# Patient Record
Sex: Male | Born: 1940 | Race: White | Hispanic: No | Marital: Married | Smoking: Former smoker
Health system: Southern US, Community
[De-identification: ages and names within clinical notes are randomized; demographics above are authoritative.]

## PROBLEM LIST (undated history)

## (undated) DIAGNOSIS — C801 Malignant (primary) neoplasm, unspecified: Secondary | ICD-10-CM

## (undated) DIAGNOSIS — I219 Acute myocardial infarction, unspecified: Secondary | ICD-10-CM

## (undated) DIAGNOSIS — N200 Calculus of kidney: Secondary | ICD-10-CM

## (undated) DIAGNOSIS — Z5189 Encounter for other specified aftercare: Secondary | ICD-10-CM

## (undated) DIAGNOSIS — I509 Heart failure, unspecified: Secondary | ICD-10-CM

## (undated) DIAGNOSIS — E119 Type 2 diabetes mellitus without complications: Secondary | ICD-10-CM

## (undated) DIAGNOSIS — I2510 Atherosclerotic heart disease of native coronary artery without angina pectoris: Secondary | ICD-10-CM

## (undated) DIAGNOSIS — I10 Essential (primary) hypertension: Secondary | ICD-10-CM

## (undated) HISTORY — PX: BACK SURGERY: SHX140

## (undated) HISTORY — PX: CORONARY ANGIOPLASTY WITH STENT PLACEMENT: SHX49

## (undated) HISTORY — PX: BLADDER REMOVAL: SHX567

## (undated) HISTORY — PX: REVISION UROSTOMY CUTANEOUS: SUR1282

---

## 2018-05-16 ENCOUNTER — Inpatient Hospital Stay (HOSPITAL_BASED_OUTPATIENT_CLINIC_OR_DEPARTMENT_OTHER)
Admission: EM | Admit: 2018-05-16 | Discharge: 2018-05-19 | DRG: 698 | Disposition: A | Discharge status: Home / Self Care | Payer: Medicare Other | Attending: Internal Medicine | Admitting: Internal Medicine

## 2018-05-16 ENCOUNTER — Other Ambulatory Visit: Payer: Self-pay

## 2018-05-16 ENCOUNTER — Encounter (HOSPITAL_BASED_OUTPATIENT_CLINIC_OR_DEPARTMENT_OTHER): Payer: Self-pay | Admitting: *Deleted

## 2018-05-16 ENCOUNTER — Emergency Department (HOSPITAL_BASED_OUTPATIENT_CLINIC_OR_DEPARTMENT_OTHER): Payer: Medicare Other

## 2018-05-16 DIAGNOSIS — Z7902 Long term (current) use of antithrombotics/antiplatelets: Secondary | ICD-10-CM

## 2018-05-16 DIAGNOSIS — M109 Gout, unspecified: Secondary | ICD-10-CM | POA: Diagnosis present

## 2018-05-16 DIAGNOSIS — Z87442 Personal history of urinary calculi: Secondary | ICD-10-CM

## 2018-05-16 DIAGNOSIS — T83518A Infection and inflammatory reaction due to other urinary catheter, initial encounter: Principal | ICD-10-CM | POA: Diagnosis present

## 2018-05-16 DIAGNOSIS — Z955 Presence of coronary angioplasty implant and graft: Secondary | ICD-10-CM

## 2018-05-16 DIAGNOSIS — I252 Old myocardial infarction: Secondary | ICD-10-CM | POA: Diagnosis not present

## 2018-05-16 DIAGNOSIS — Z7984 Long term (current) use of oral hypoglycemic drugs: Secondary | ICD-10-CM

## 2018-05-16 DIAGNOSIS — N390 Urinary tract infection, site not specified: Secondary | ICD-10-CM | POA: Diagnosis present

## 2018-05-16 DIAGNOSIS — I110 Hypertensive heart disease with heart failure: Secondary | ICD-10-CM | POA: Diagnosis present

## 2018-05-16 DIAGNOSIS — Z8551 Personal history of malignant neoplasm of bladder: Secondary | ICD-10-CM | POA: Diagnosis not present

## 2018-05-16 DIAGNOSIS — E1140 Type 2 diabetes mellitus with diabetic neuropathy, unspecified: Secondary | ICD-10-CM | POA: Diagnosis present

## 2018-05-16 DIAGNOSIS — R509 Fever, unspecified: Secondary | ICD-10-CM

## 2018-05-16 DIAGNOSIS — N3000 Acute cystitis without hematuria: Secondary | ICD-10-CM | POA: Diagnosis present

## 2018-05-16 DIAGNOSIS — Z7982 Long term (current) use of aspirin: Secondary | ICD-10-CM | POA: Diagnosis not present

## 2018-05-16 DIAGNOSIS — N179 Acute kidney failure, unspecified: Secondary | ICD-10-CM | POA: Diagnosis present

## 2018-05-16 DIAGNOSIS — Z885 Allergy status to narcotic agent status: Secondary | ICD-10-CM | POA: Diagnosis not present

## 2018-05-16 DIAGNOSIS — I4510 Unspecified right bundle-branch block: Secondary | ICD-10-CM | POA: Diagnosis present

## 2018-05-16 DIAGNOSIS — A4159 Other Gram-negative sepsis: Secondary | ICD-10-CM | POA: Diagnosis present

## 2018-05-16 DIAGNOSIS — I2510 Atherosclerotic heart disease of native coronary artery without angina pectoris: Secondary | ICD-10-CM | POA: Diagnosis present

## 2018-05-16 DIAGNOSIS — Z888 Allergy status to other drugs, medicaments and biological substances status: Secondary | ICD-10-CM

## 2018-05-16 DIAGNOSIS — K219 Gastro-esophageal reflux disease without esophagitis: Secondary | ICD-10-CM | POA: Diagnosis present

## 2018-05-16 DIAGNOSIS — I509 Heart failure, unspecified: Secondary | ICD-10-CM | POA: Diagnosis present

## 2018-05-16 DIAGNOSIS — Z87891 Personal history of nicotine dependence: Secondary | ICD-10-CM

## 2018-05-16 DIAGNOSIS — R7881 Bacteremia: Secondary | ICD-10-CM | POA: Diagnosis not present

## 2018-05-16 DIAGNOSIS — Z936 Other artificial openings of urinary tract status: Secondary | ICD-10-CM | POA: Diagnosis not present

## 2018-05-16 HISTORY — DX: Atherosclerotic heart disease of native coronary artery without angina pectoris: I25.10

## 2018-05-16 HISTORY — DX: Acute myocardial infarction, unspecified: I21.9

## 2018-05-16 HISTORY — DX: Type 2 diabetes mellitus without complications: E11.9

## 2018-05-16 HISTORY — DX: Heart failure, unspecified: I50.9

## 2018-05-16 HISTORY — DX: Essential (primary) hypertension: I10

## 2018-05-16 HISTORY — DX: Malignant (primary) neoplasm, unspecified: C80.1

## 2018-05-16 HISTORY — DX: Calculus of kidney: N20.0

## 2018-05-16 HISTORY — DX: Encounter for other specified aftercare: Z51.89

## 2018-05-16 LAB — URINALYSIS, MICROSCOPIC (REFLEX)

## 2018-05-16 LAB — COMPREHENSIVE METABOLIC PANEL
ALT: 14 U/L (ref 0–44)
AST: 12 U/L — AB (ref 15–41)
Albumin: 3.3 g/dL — ABNORMAL LOW (ref 3.5–5.0)
Alkaline Phosphatase: 70 U/L (ref 38–126)
Anion gap: 9 (ref 5–15)
BUN: 27 mg/dL — ABNORMAL HIGH (ref 8–23)
CO2: 21 mmol/L — ABNORMAL LOW (ref 22–32)
Calcium: 8.9 mg/dL (ref 8.9–10.3)
Chloride: 105 mmol/L (ref 98–111)
Creatinine, Ser: 1.33 mg/dL — ABNORMAL HIGH (ref 0.61–1.24)
GFR calc Af Amer: 59 mL/min — ABNORMAL LOW (ref >60)
GFR calc non Af Amer: 51 mL/min — ABNORMAL LOW (ref >60)
Glucose, Bld: 182 mg/dL — ABNORMAL HIGH (ref 70–99)
Potassium: 3.8 mmol/L (ref 3.5–5.1)
Sodium: 135 mmol/L (ref 135–145)
Total Bilirubin: 1.2 mg/dL (ref 0.3–1.2)
Total Protein: 6.7 g/dL (ref 6.5–8.1)

## 2018-05-16 LAB — CBC WITH DIFFERENTIAL/PLATELET
Abs Immature Granulocytes: 0.2 10*3/uL — ABNORMAL HIGH (ref 0.00–0.07)
BASOS ABS: 0 10*3/uL (ref 0.0–0.1)
Basophils Relative: 0 %
Eosinophils Absolute: 0 10*3/uL (ref 0.0–0.5)
Eosinophils Relative: 0 %
HCT: 40.4 % (ref 39.0–52.0)
Hemoglobin: 13.1 g/dL (ref 13.0–17.0)
Immature Granulocytes: 1 %
Lymphocytes Relative: 4 %
Lymphs Abs: 0.7 10*3/uL (ref 0.7–4.0)
MCH: 27.3 pg (ref 26.0–34.0)
MCHC: 32.4 g/dL (ref 30.0–36.0)
MCV: 84.2 fL (ref 80.0–100.0)
Monocytes Absolute: 1.4 10*3/uL — ABNORMAL HIGH (ref 0.1–1.0)
Monocytes Relative: 9 %
NRBC: 0 % (ref 0.0–0.2)
Neutro Abs: 14.4 10*3/uL — ABNORMAL HIGH (ref 1.7–7.7)
Neutrophils Relative %: 86 %
Platelets: 240 10*3/uL (ref 150–400)
RBC: 4.8 MIL/uL (ref 4.22–5.81)
RDW: 14.8 % (ref 11.5–15.5)
WBC: 16.8 10*3/uL — ABNORMAL HIGH (ref 4.0–10.5)

## 2018-05-16 LAB — URINALYSIS, ROUTINE W REFLEX MICROSCOPIC
BILIRUBIN URINE: NEGATIVE
Glucose, UA: NEGATIVE mg/dL
KETONES UR: NEGATIVE mg/dL
Nitrite: NEGATIVE
Protein, ur: NEGATIVE mg/dL
Specific Gravity, Urine: 1.01 (ref 1.005–1.030)
pH: 7 (ref 5.0–8.0)

## 2018-05-16 LAB — I-STAT CG4 LACTIC ACID, ED: LACTIC ACID, VENOUS: 1.23 mmol/L (ref 0.5–1.9)

## 2018-05-16 MED ORDER — SODIUM CHLORIDE 0.9 % IV BOLUS
500.0000 mL | Freq: Once | INTRAVENOUS | Status: AC
  Administered 2018-05-16: 500 mL via INTRAVENOUS

## 2018-05-16 MED ORDER — SODIUM CHLORIDE 0.9 % IV SOLN
1.0000 g | Freq: Two times a day (BID) | INTRAVENOUS | Status: DC
  Administered 2018-05-16 – 2018-05-17 (×2): 1 g via INTRAVENOUS
  Filled 2018-05-16 (×3): qty 1

## 2018-05-16 MED ORDER — SODIUM CHLORIDE 0.9 % IV SOLN
1.0000 g | Freq: Once | INTRAVENOUS | Status: AC
  Administered 2018-05-16: 1 g via INTRAVENOUS
  Filled 2018-05-16: qty 10

## 2018-05-16 MED ORDER — ACETAMINOPHEN 325 MG PO TABS
650.0000 mg | ORAL_TABLET | Freq: Once | ORAL | Status: AC
  Administered 2018-05-16: 650 mg via ORAL
  Filled 2018-05-16: qty 2

## 2018-05-16 MED ORDER — ONDANSETRON HCL 4 MG/2ML IJ SOLN: 4.0000 mg | Freq: Four times a day (QID) | INTRAMUSCULAR | Status: DC | PRN

## 2018-05-16 MED ORDER — SODIUM CHLORIDE 0.9 % IV SOLN
INTRAVENOUS | Status: DC | PRN
  Administered 2018-05-16: 500 mL via INTRAVENOUS

## 2018-05-16 MED ORDER — GABAPENTIN 300 MG PO CAPS
300.0000 mg | ORAL_CAPSULE | Freq: Every day | ORAL | Status: DC
  Administered 2018-05-16 – 2018-05-18 (×3): 300 mg via ORAL
  Filled 2018-05-16 (×3): qty 1

## 2018-05-16 MED ORDER — ASPIRIN EC 81 MG PO TBEC
81.0000 mg | DELAYED_RELEASE_TABLET | Freq: Every day | ORAL | Status: DC
  Administered 2018-05-17 – 2018-05-18 (×2): 81 mg via ORAL
  Filled 2018-05-16 (×4): qty 1

## 2018-05-16 MED ORDER — SODIUM CHLORIDE 0.9 % IV BOLUS
1000.0000 mL | Freq: Once | INTRAVENOUS | Status: AC
  Administered 2018-05-16: 1000 mL via INTRAVENOUS

## 2018-05-16 MED ORDER — FAMOTIDINE 20 MG PO TABS
20.0000 mg | ORAL_TABLET | Freq: Every day | ORAL | Status: DC
  Administered 2018-05-16 – 2018-05-18 (×3): 20 mg via ORAL
  Filled 2018-05-16 (×3): qty 1

## 2018-05-16 MED ORDER — CLOPIDOGREL BISULFATE 75 MG PO TABS
75.0000 mg | ORAL_TABLET | Freq: Every day | ORAL | Status: DC
  Administered 2018-05-17 – 2018-05-19 (×3): 75 mg via ORAL
  Filled 2018-05-16 (×3): qty 1

## 2018-05-16 MED ORDER — ATORVASTATIN CALCIUM 10 MG PO TABS
20.0000 mg | ORAL_TABLET | Freq: Every day | ORAL | Status: DC
  Administered 2018-05-17 – 2018-05-18 (×2): 20 mg via ORAL
  Filled 2018-05-16 (×2): qty 2

## 2018-05-16 MED ORDER — ONDANSETRON HCL 4 MG PO TABS: 4.0000 mg | ORAL_TABLET | Freq: Four times a day (QID) | ORAL | Status: DC | PRN

## 2018-05-16 MED ORDER — ALLOPURINOL 300 MG PO TABS
300.0000 mg | ORAL_TABLET | Freq: Every day | ORAL | Status: DC
  Administered 2018-05-17 – 2018-05-19 (×3): 300 mg via ORAL
  Filled 2018-05-16 (×3): qty 1

## 2018-05-16 MED ORDER — ENOXAPARIN SODIUM 40 MG/0.4ML ~~LOC~~ SOLN
40.0000 mg | SUBCUTANEOUS | Status: DC
  Administered 2018-05-18 – 2018-05-19 (×2): 40 mg via SUBCUTANEOUS
  Filled 2018-05-16 (×3): qty 0.4

## 2018-05-16 NOTE — Plan of Care (Signed)
[facility] PLAN OF CARE NOTE TRANSFER - ADMISSION  FROM: [facility] ED TO: [facility] (med-surg floor)   PATIENT MRN: [id number]    BRIEF SUMMARY: Jim Bowers is a 78 y.o. 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 Urine reportedly appeared "dark" transiently. Vitals at [facility] shows no fever, BP 108-126/77; HR 80s; normal SO2. Initial UA shows large leuk est and +bacteria, WBC and RBC. Cultures pending.    DDx includes ascending UTI with or without obstruction; other abdominal infection. Initial exam reported prior to transfer suggest patient is non-toxic and hemodynamically stable.    PLAN: - admit to [facility] medicine floor - urine and blood cultures in process - continue abx and IVF - recommend broaden to cefepime upon transfer given increased risk of pseudomonas from urostomy - hold home beta-blockers overnight - low threshold for CT abd/pelvis with contrast and urology consultation if symptoms do not improve in 24 hours     Colbert Ewing, MD Pager [phone] Go to [email or web address] - [email or web address] for contact info [facility] - Office  [phone]

## 2018-05-16 NOTE — ED Provider Notes (Signed)
[facility] EMERGENCY DEPARTMENT Provider Note   CSN: [id number] Arrival date & time: 05/16/18  1440     History   Chief Complaint Chief Complaint  Patient presents with  . Fever  . Back Pain    HPI Jim Bowers is a 78 y.o. male.  Patient with history of urostomy status post bladder and prostate cancer, coronary artery disease, history of urosepsis, diabetes --presents to the emergency department today with complaint of fatigue, fever to 100.5 F at home as well as intermittent bilateral lower back pain.  Patient reports decreased energy and appetite starting last night.  He slept and woke up with persistent symptoms today.  He has had recent cough that he relates to the fact that his home is being renovated.  Otherwise no URI symptoms.  He states that his urine has appeared dark but was clear after he drank water last night.  He denies any nausea, vomiting, or diarrhea.  No skin rashes.  No weakness, numbness, or tingling in the legs. He is worried about recurrent sepsis.      Past Medical History:  Diagnosis Date  . Blood transfusion without reported diagnosis   . Cancer [facility])    bladder and prostate  . CHF (congestive heart failure) ([place])   . Coronary artery disease   . Diabetes mellitus without complication ([place])   . Heart attack ([place])   . Hypertension   . Kidney stone     There are no active problems to display for this patient.   Past Surgical History:  Procedure Laterality Date  . BACK SURGERY    . BLADDER REMOVAL    . CORONARY ANGIOPLASTY WITH STENT PLACEMENT    . REVISION UROSTOMY CUTANEOUS          Home Medications    Prior to Admission medications   Medication Sig Start Date End Date Taking? Authorizing Provider  allopurinol (ZYLOPRIM) 300 MG tablet TAKE 1 TABLET(300 MG) BY MOUTH DAILY 04/19/18  Yes [provider]  atorvastatin (LIPITOR) 20 MG tablet TAKE 1 TABLET(20 MG) BY MOUTH DAILY 03/17/18  Yes [provider]   carvedilol (COREG) 25 MG tablet Take by mouth. 11/18/17  Yes [provider]  clopidogrel (PLAVIX) 75 MG tablet TAKE 1 TABLET(75 MG) BY MOUTH DAILY 02/25/18  Yes [provider]  gabapentin (NEURONTIN) 300 MG capsule Take by mouth. 02/25/18  Yes [provider]  loperamide (IMODIUM) 2 MG capsule Take by mouth. 10/16/16  Yes [provider]  losartan (COZAAR) 50 MG tablet TAKE 1 TABLET(50 MG) BY MOUTH DAILY 03/17/18  Yes [provider]  metFORMIN (GLUCOPHAGE-XR) 500 MG 24 hr tablet Take by mouth. 01/11/18  Yes [provider]  nitroGLYCERIN (NITROSTAT) 0.4 MG SL tablet ONE TABLET UNDER TONGUE AS NEEDED FOR CHEST PAIN EVERY 5 MINUTES FOR UP TO 3 DOSES IN A ROW 08/19/17  Yes [provider]  sitaGLIPtin (JANUVIA) 100 MG tablet Take by mouth. 04/14/18  Yes [provider]  traMADol (ULTRAM) 50 MG tablet Take by mouth. 07/02/16  Yes [provider]  aspirin EC 81 MG tablet Take by mouth.    [provider]  famotidine (PEPCID) 20 MG tablet Take by mouth.    [provider]  nystatin (MYCOSTATIN/NYSTOP) powder Apply topically.    [provider]  vitamin B-12 (CYANOCOBALAMIN) 1000 MCG tablet Take by mouth.    [provider]    Family History No family history on file.  Social History Social History  Tobacco Use  . Smoking status: Former Research scientist (life sciences)  . Smokeless tobacco: Former Network engineer Use Topics  . Alcohol use: Never    Frequency: Never  . Drug use: Never     Allergies   Codeine; Hyoscyamine; Glimepiride; Pioglitazone; and Colchicine   Review of Systems Review of Systems  Constitutional: Positive for appetite change and fatigue. Negative for fever.  HENT: Negative for rhinorrhea and sore throat.   Eyes: Negative for redness.  Respiratory: Positive for cough.   Cardiovascular: Negative for chest pain.  Gastrointestinal: Negative for abdominal pain, diarrhea, nausea  and vomiting.  Genitourinary: Negative for hematuria.  Musculoskeletal: Positive for back pain. Negative for myalgias.  Skin: Negative for rash.  Neurological: Negative for headaches.     Physical Exam Updated Vital Signs BP 108/62 (BP Location: Right Arm)   Pulse 88   Temp 98.2 F (36.8 C) (Oral)   Resp 18   Ht 5\' 7"  (1.702 m)   Wt 81.6 kg   SpO2 100%   BMI 28.19 kg/m   Physical Exam Vitals signs and nursing note reviewed.  Constitutional:      Appearance: He is well-developed.  HENT:     Head: Normocephalic and atraumatic.     Mouth/Throat:     Mouth: Mucous membranes are moist.  Eyes:     General:        Right eye: No discharge.        Left eye: No discharge.     Conjunctiva/sclera: Conjunctivae normal.  Neck:     Musculoskeletal: Normal range of motion and neck supple.  Cardiovascular:     Rate and Rhythm: Normal rate and regular rhythm.     Heart sounds: Normal heart sounds.  Pulmonary:     Effort: Pulmonary effort is normal.     Breath sounds: Normal breath sounds.  Abdominal:     Palpations: Abdomen is soft.     Tenderness: There is no abdominal tenderness. There is no right CVA tenderness, left CVA tenderness, guarding or rebound.     Comments: Urostomy in place lower abdomen  Skin:    General: Skin is warm and dry.  Neurological:     General: No focal deficit present.     Mental Status: He is alert and oriented to person, place, and time.     Comments: Normal strength lower extremities      ED Treatments / Results  Labs (all labs ordered are listed, but only abnormal results are displayed) Labs Reviewed  COMPREHENSIVE METABOLIC PANEL - Abnormal; Notable for the following components:      Result Value   CO2 21 (*)    Glucose, Bld 182 (*)    BUN 27 (*)    Creatinine, Ser 1.33 (*)    Albumin 3.3 (*)    AST 12 (*)    GFR calc non Af Amer 51 (*)    GFR calc Af Amer 59 (*)    All other components within normal limits  CBC WITH  DIFFERENTIAL/PLATELET - Abnormal; Notable for the following components:   WBC 16.8 (*)    Neutro Abs 14.4 (*)    Monocytes Absolute 1.4 (*)    Abs Immature Granulocytes 0.20 (*)    All other components within normal limits  URINALYSIS, ROUTINE W REFLEX MICROSCOPIC - Abnormal; Notable for the following components:   APPearance HAZY (*)    Hgb urine dipstick SMALL (*)    Leukocytes, UA LARGE (*)    All other components within  normal limits  URINALYSIS, MICROSCOPIC (REFLEX) - Abnormal; Notable for the following components:   Bacteria, UA MANY (*)    All other components within normal limits  CULTURE, BLOOD (ROUTINE X 2)  CULTURE, BLOOD (ROUTINE X 2)  URINE CULTURE  I-STAT CG4 LACTIC ACID, ED  I-STAT CG4 LACTIC ACID, ED    EKG EKG Interpretation  Date/Time:  Sunday May 16 2018 16:32:00 EST Ventricular Rate:  82 PR Interval:    QRS Duration: 130 QT Interval:  394 QTC Calculation: 461 R Axis:   -73 Text Interpretation:  Sinus rhythm Right bundle branch block Anterior infarct, old No old tracing to compare Confirmed by Malvin Johns [phone]) on 05/16/2018 5:02:13 PM   Radiology Dg Chest 2 View  Result Date: 05/16/2018 CLINICAL DATA:  Fever and back pain EXAM: CHEST - 2 VIEW COMPARISON:  None. FINDINGS: Cardiac shadows within normal limits. Diffuse aortic calcifications are noted. The lungs are clear. Mild degenerative change of the thoracic spine is noted. IMPRESSION: No acute abnormality noted. Electronically Signed   By: Inez Catalina M.D.   On: 05/16/2018 16:19    Procedures Procedures (including critical care time)  Medications Ordered in ED Medications  0.9 %  sodium chloride infusion ( Intravenous Stopped 05/16/18 1837)  cefTRIAXone (ROCEPHIN) 1 g in sodium chloride 0.9 % 100 mL IVPB ( Intravenous Stopped 05/16/18 1803)  sodium chloride 0.9 % bolus 500 mL ( Intravenous Stopped 05/16/18 1755)     Initial Impression / Assessment and Plan / ED Course  I have reviewed  the triage vital signs and the nursing notes.  Pertinent labs & imaging results that were available during my care of the patient were reviewed by me and considered in my medical decision making (see chart for details).     Patient seen and examined. Work-up initiated. Medications ordered.   Vital signs reviewed and are as follows: BP 108/62 (BP Location: Right Arm)   Pulse 88   Temp 98.2 F (36.8 C) (Oral)   Resp 18   Ht 5\' 7"  (1.702 m)   Wt 81.6 kg   SpO2 100%   BMI 28.19 kg/m   5:17 PM discussed patient with Dr. Tamera Punt who was seen earlier.  Patient in family updated on results.  We discussed likely diagnosis is urinary tract infection at this time.  We discussed normal lactic acid, elevated white blood cell count, pending blood and urine cultures.  Will give fluids, antibiotics and oral fluid challenge.  We discussed the pros and cons of admission versus discharge to home with close PCP follow-up.  Awaiting patient input to see what he is most comfortable with.  6:27 PM Pt requests admission to hospital. Will see if [facility] at [facility].    7:33 PM Spoke with Dr. Hampton Abbot who accepts patient for admission.   Final Clinical Impressions(s) / ED Diagnoses   Final diagnoses:  Acute cystitis without hematuria  Fever, unspecified fever cause   Admit febrile UTI.    ED Discharge Orders    None       Carlisle Cater, Hershal Coria 05/16/18 1934    Malvin Johns, MD 05/16/18 2158

## 2018-05-16 NOTE — ED Notes (Signed)
Pt noted to be tacypneic with increased work of breathing. Respiratory and provider notified.

## 2018-05-16 NOTE — H&P (Signed)
History and Physical  Jim Bowers TMH:[id number] DOB: 06-Apr-1941 DOA: 05/16/2018 1504  Referring physician: [facility]) PCP: Thomes Dinning, MD  Outpatient Specialists: oncology at [facility]; cardiology at [facility])  HISTORY   Chief Complaint: suspected UTI (transfer from [facility])  HPI: [name] is a 78 y.o. 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 Temp measured at home was 100.7F.  Patient reported seeing dark urine output in his urostomy bag yesterday evening that lightened in color with increased fluid intake. Otherwise patient has not seen any sediment or blood in urine output. Patient reports changing urostomy bag every 3-4 days as directed.   Last year, he had similar symptoms (bilateral flank pain, malaise) but did not seek medical care until he became altered and much sicker -- was ultimately admitted to ICU at OSH in [facility] for several days for urosepsis.     Review of Systems:  +low grade fever; bilateral flank pain - no cough - no chest pain, dyspnea on exertion - no edema, PND, orthopnea - no nausea/vomiting; no tarry, melanotic or bloody stools - no weight changes Rest of systems reviewed are negative, except as per above history.   ED course:  Vitals Blood pressure 122/68, pulse 92, temperature 99.3 F (37.4 C), temperature source Oral, resp. rate 20, height 5\' 7"  (1.702 m), weight 81.6 kg, SpO2 98 %. At [facility] ED: received IV ceftriaxone 1gm x 1; NS bolus 500cc x 1; tylenol 650mg  x 1 (prior to transfer to [facility])  Past Medical History:  Diagnosis Date  . Blood transfusion without reported diagnosis   . Cancer [facility])    bladder and prostate  . CHF (congestive heart failure) ([place])   . Coronary artery disease   . Diabetes mellitus without complication ([place])   . Heart attack ([place])   . Hypertension    . Kidney stone    Past Surgical History:  Procedure Laterality Date  . BACK SURGERY    . BLADDER REMOVAL    . CORONARY ANGIOPLASTY WITH STENT PLACEMENT    . REVISION UROSTOMY CUTANEOUS      Social History:  reports that he has quit smoking. He has quit using smokeless tobacco. He reports that he does not drink alcohol or use drugs.  Allergies  Allergen Reactions  . Codeine Swelling  . Hyoscyamine Swelling    Throat swells, difficulty breathing  . Glimepiride Other (See Comments)    Low bs levels  . Pioglitazone Other (See Comments)    Pt has bladder cancer  . Colchicine Nausea Only    GI UPSET  . Iodinated Diagnostic Agents Rash    Patient states shellfish allergy; no IV contrast allergy  . Shellfish Allergy Rash    No family history on file.    Prior to Admission medications   Medication Sig Start Date End Date Taking? Authorizing Provider  allopurinol (ZYLOPRIM) 300 MG tablet Take 300 mg by mouth daily.  04/19/18  Yes [provider]  aspirin EC 81 MG tablet Take 81 mg by mouth every evening.    Yes [provider]  atorvastatin (LIPITOR) 20 MG tablet Take 20 mg by mouth every evening.  03/17/18  Yes [provider]  carvedilol (COREG) 25 MG tablet Take 12.5 mg by mouth 2 (two) times daily with a meal.  11/18/17  Yes [provider]  clopidogrel (PLAVIX) 75 MG tablet Take 75 mg by mouth daily after breakfast.  02/25/18  Yes [provider]  famotidine (PEPCID) 20 MG tablet Take 20 mg by mouth 2 (two) times daily.    Yes [provider]  gabapentin (NEURONTIN) 300 MG capsule Take 300 mg by mouth 2 (two) times daily.  02/25/18  Yes [provider]  loperamide (IMODIUM) 2 MG capsule Take 2 mg by mouth every 8 (eight) hours as needed for diarrhea or loose stools.  10/16/16  Yes [provider]  losartan (COZAAR) 50 MG tablet Take 50 mg by mouth daily after breakfast.  03/17/18  Yes [provider]    metFORMIN (GLUCOPHAGE-XR) 500 MG 24 hr tablet Take 500 mg by mouth every evening.  01/11/18  Yes [provider]  Multiple Vitamins-Minerals (ICAPS AREDS 2 PO) Take 1 tablet by mouth 2 (two) times daily.   Yes [provider]  nitroGLYCERIN (NITROSTAT) 0.4 MG SL tablet Place 0.4 mg under the tongue every 5 (five) minutes as needed for chest pain.  08/19/17  Yes [provider]  nystatin (MYCOSTATIN/NYSTOP) powder Apply 1 g topically 2 (two) times daily as needed (irritation).    Yes [provider]  sitaGLIPtin (JANUVIA) 100 MG tablet Take 100 mg by mouth daily after breakfast.  04/14/18  Yes [provider]  traMADol (ULTRAM) 50 MG tablet Take 50 mg by mouth every 6 (six) hours as needed for moderate pain or severe pain.  07/02/16  Yes [provider]  vitamin B-12 (CYANOCOBALAMIN) 1000 MCG tablet Take 1,000 mcg by mouth every evening.    Yes [provider]    PHYSICAL EXAM   Temp:  [98.2 F (36.8 C)-100.7 F (38.2 C)] 99.3 F (37.4 C) (01/19 2223) Pulse Rate:  [81-101] 92 (01/19 2223) Resp:  [14-30] 20 (01/19 2223) BP: (108-126)/(60-77) 122/68 (01/19 2223) SpO2:  [95 %-100 %] 98 % (01/19 2223) Weight:  [81.6 kg] 81.6 kg (01/19 1450)  BP 122/68 (BP Location: Left Arm)   Pulse 92   Temp 99.3 F (37.4 C) (Oral)   Resp 20   Ht 5\' 7"  (1.702 m)   Wt 81.6 kg   SpO2 98%   BMI 28.19 kg/m    GEN well-nourished elderly caucasian male; resting comfortably in bed  HEENT NCAT EOM intact PERRL; clear oropharynx, no cervical LAD; dry mucus membranes  JVP estimated 5 cm H2O above RA; no HJR ; no carotid bruits b/l ;  CV regular normal rate; normal S1 and S2; no m/r/g or S3/S4; PMI non displaced;   RESP CTA b/l; breathing unlabored and symmetric  ABD soft NT ND +normoactive BS; +mild bilateral CVA tenderness  EXT warm throughout b/l; no peripheral edema b/l  PULSES  DP and radials 2+ intact b/l  SKIN/MSK no rashes or lesions   NEURO/PSYCH AAOx4; no focal deficits   DATA   LABS ON ADMISSION:  Basic Metabolic Panel: Recent Labs  Lab 05/16/18 1558  NA 135  K 3.8  CL 105  CO2 21*  GLUCOSE 182*  BUN 27*  CREATININE 1.33*  CALCIUM 8.9   CBC: Recent Labs  Lab 05/16/18 1558  WBC 16.8*  NEUTROABS 14.4*  HGB 13.1  HCT 40.4  MCV 84.2  PLT 240   Liver Function Tests: Recent Labs  Lab 05/16/18 1558  AST 12*  ALT 14  ALKPHOS 70  BILITOT 1.2  PROT 6.7  ALBUMIN 3.3*   No results for input(s):  LIPASE, AMYLASE in the last 168 hours. No results for input(s): AMMONIA in the last 168 hours. Coagulation:  No results found for: INR, PROTIME No results found for: PTT Lactic Acid, Venous:     Component Value Date/Time   LATICACIDVEN 1.23 05/16/2018 1606   Cardiac Enzymes: No results for input(s): CKTOTAL, CKMB, CKMBINDEX, TROPONINI in the last 168 hours. Urinalysis:    Component Value Date/Time   COLORURINE YELLOW 05/16/2018 1614   APPEARANCEUR HAZY (A) 05/16/2018 1614   LABSPEC 1.010 05/16/2018 1614   PHURINE 7.0 05/16/2018 1614   GLUCOSEU NEGATIVE 05/16/2018 1614   HGBUR SMALL (A) 05/16/2018 1614   BILIRUBINUR NEGATIVE 05/16/2018 1614   KETONESUR NEGATIVE 05/16/2018 1614   PROTEINUR NEGATIVE 05/16/2018 1614   NITRITE NEGATIVE 05/16/2018 1614   LEUKOCYTESUR LARGE (A) 05/16/2018 1614    BNP (last 3 results) No results for input(s): PROBNP in the last 8760 hours. CBG: No results for input(s): GLUCAP in the last 168 hours.  Radiological Exams on Admission: Dg Chest 2 View  Result Date: 05/16/2018 CLINICAL DATA:  Fever and back pain EXAM: CHEST - 2 VIEW COMPARISON:  None. FINDINGS: Cardiac shadows within normal limits. Diffuse aortic calcifications are noted. The lungs are clear. Mild degenerative change of the thoracic spine is noted. IMPRESSION: No acute abnormality noted. Electronically Signed   By: Inez Catalina M.D.   On: 05/16/2018 16:19    EKG: Independently reviewed. NSR, RBBB,  old anterior infarct  I have reviewed the patient's previous electronic chart records, labs, and other data.   ASSESSMENT AND PLAN   Assessment: Jim Bowers is a 78 y.o. 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 Patient reports sx were similar to previous episode of urosepsis, although not as severe, since he sought medical care earlier on this admission. Good urine output in urostomy; thus obstruction unlikely at this time. Will broaden abx coverage to include pseudomonas overnight. Non-toxic appearing.    Active Problems:   UTI (urinary tract infection)   Plan:   # UTI, concern for ascending infection in setting of urostomy/ileal conduit. Prior hx of urosepsis - urine and blood cultures in process - abx: s/p ceftriaxone D1: 05/16/18 at [facility] ED - switch to cefepime 1gm q12h (05/17/18) - narrow abx as per cultures - urostomy nursing care - urine and blood cultures in process - NS bolus overnight - tylenol prn   # AKI with Cr baseline 1.1-1.2, likely pre-renal vs intrinsic in setting of infection  - s/p IVF as above  - hold home losartan and metformin  - avoid NSAIDs and other nephrotoxic agents  - monitor with daily BMP  # Hx of CAD s/p remote MI and PCI - resume home aspirin and atorvastatin tomorrow  # Hx of HTN  - hold home coreg and losartan overnight   # Hx of gout - no flare at this time - resume allopurinol 300mg  daily  # Hx of reflux - resume pepcid  # Hx of neuropathic pain - resume gabapentin 300mg  nightly   # DMT2 - not on insulin - hold metformin and sitagliptin while inpatient - fingersticks ACHS and low dose SSI with meals     DVT Prophylaxis: lovenox  Code Status:  Full Code Family Communication: discussed plan with patient at bedside Disposition Plan: admit to floor; dispo  pending urine  culture results and sx   Patient contact: Extended Emergency Contact Information Primary Emergency Contact: Niles, Sebastian Phone: [phone] Relation: Spouse  Time spent: > 35 mins  Colbert Ewing, MD [facility] Pager [phone]  If 7PM-7AM, please contact night-coverage [email or web address] Password [facility] 05/16/2018, 11:57 PM

## 2018-05-16 NOTE — ED Notes (Signed)
Report given to [facility]. 

## 2018-05-16 NOTE — ED Triage Notes (Signed)
Pt reports fever today and back pain x 2 days. Concerned for kidney infection. Pt reports increased fatigue and feeling unwell. Hx of sepsis last year. Pt has hx of bladder ca and has a urostomy. He took 2 tylenol pta for temp 100.5 at home. Alert and oriented

## 2018-05-16 NOTE — ED Notes (Signed)
AMBULATING:  Pt able to ambulate unassisted with slow and steady gait around half of department. Pt denies any dizziness or light headed feeling. Pt c/o feeling tired all over and weak.

## 2018-05-16 NOTE — ED Notes (Signed)
Pt remains on cardiac monitor and auto VS 

## 2018-05-17 DIAGNOSIS — R7881 Bacteremia: Secondary | ICD-10-CM

## 2018-05-17 LAB — GLUCOSE, CAPILLARY
Glucose-Capillary: 157 mg/dL — ABNORMAL HIGH (ref 70–99)
Glucose-Capillary: 163 mg/dL — ABNORMAL HIGH (ref 70–99)
Glucose-Capillary: 164 mg/dL — ABNORMAL HIGH (ref 70–99)
Glucose-Capillary: 203 mg/dL — ABNORMAL HIGH (ref 70–99)
Glucose-Capillary: 225 mg/dL — ABNORMAL HIGH (ref 70–99)

## 2018-05-17 LAB — CBC WITH DIFFERENTIAL/PLATELET
Abs Immature Granulocytes: 0.14 10*3/uL — ABNORMAL HIGH (ref 0.00–0.07)
BASOS ABS: 0 10*3/uL (ref 0.0–0.1)
BASOS PCT: 0 %
Eosinophils Absolute: 0 10*3/uL (ref 0.0–0.5)
Eosinophils Relative: 0 %
HCT: 37 % — ABNORMAL LOW (ref 39.0–52.0)
Hemoglobin: 11.7 g/dL — ABNORMAL LOW (ref 13.0–17.0)
Immature Granulocytes: 1 %
Lymphocytes Relative: 4 %
Lymphs Abs: 0.5 10*3/uL — ABNORMAL LOW (ref 0.7–4.0)
MCH: 27.4 pg (ref 26.0–34.0)
MCHC: 31.6 g/dL (ref 30.0–36.0)
MCV: 86.7 fL (ref 80.0–100.0)
Monocytes Absolute: 1 10*3/uL (ref 0.1–1.0)
Monocytes Relative: 7 %
NRBC: 0 % (ref 0.0–0.2)
Neutro Abs: 13.4 10*3/uL — ABNORMAL HIGH (ref 1.7–7.7)
Neutrophils Relative %: 88 %
Platelets: 192 10*3/uL (ref 150–400)
RBC: 4.27 MIL/uL (ref 4.22–5.81)
RDW: 14.9 % (ref 11.5–15.5)
WBC: 15.1 10*3/uL — AB (ref 4.0–10.5)

## 2018-05-17 LAB — BLOOD CULTURE ID PANEL (REFLEXED)
Acinetobacter baumannii: NOT DETECTED
Candida albicans: NOT DETECTED
Candida glabrata: NOT DETECTED
Candida krusei: NOT DETECTED
Candida parapsilosis: NOT DETECTED
Candida tropicalis: NOT DETECTED
Carbapenem resistance: NOT DETECTED
ENTEROBACTER CLOACAE COMPLEX: NOT DETECTED
Enterobacteriaceae species: DETECTED — AB
Enterococcus species: NOT DETECTED
Escherichia coli: NOT DETECTED
Haemophilus influenzae: NOT DETECTED
Klebsiella oxytoca: DETECTED — AB
Klebsiella pneumoniae: NOT DETECTED
Listeria monocytogenes: NOT DETECTED
Neisseria meningitidis: NOT DETECTED
PSEUDOMONAS AERUGINOSA: NOT DETECTED
Proteus species: NOT DETECTED
Serratia marcescens: NOT DETECTED
Staphylococcus aureus (BCID): NOT DETECTED
Staphylococcus species: NOT DETECTED
Streptococcus agalactiae: NOT DETECTED
Streptococcus pneumoniae: NOT DETECTED
Streptococcus pyogenes: NOT DETECTED
Streptococcus species: NOT DETECTED

## 2018-05-17 LAB — BASIC METABOLIC PANEL
Anion gap: 9 (ref 5–15)
BUN: 27 mg/dL — ABNORMAL HIGH (ref 8–23)
CO2: 18 mmol/L — ABNORMAL LOW (ref 22–32)
Calcium: 8.6 mg/dL — ABNORMAL LOW (ref 8.9–10.3)
Chloride: 112 mmol/L — ABNORMAL HIGH (ref 98–111)
Creatinine, Ser: 1.3 mg/dL — ABNORMAL HIGH (ref 0.61–1.24)
GFR calc Af Amer: >60 mL/min (ref >60)
GFR calc non Af Amer: 53 mL/min — ABNORMAL LOW (ref >60)
Glucose, Bld: 203 mg/dL — ABNORMAL HIGH (ref 70–99)
POTASSIUM: 3.8 mmol/L (ref 3.5–5.1)
SODIUM: 139 mmol/L (ref 135–145)

## 2018-05-17 LAB — MAGNESIUM: MAGNESIUM: 1.9 mg/dL (ref 1.7–2.4)

## 2018-05-17 MED ORDER — ACETAMINOPHEN 325 MG PO TABS
650.0000 mg | ORAL_TABLET | Freq: Four times a day (QID) | ORAL | Status: DC | PRN
  Administered 2018-05-17 – 2018-05-19 (×3): 650 mg via ORAL
  Filled 2018-05-17 (×3): qty 2

## 2018-05-17 MED ORDER — LACTATED RINGERS IV SOLN
INTRAVENOUS | Status: AC
  Administered 2018-05-17 – 2018-05-18 (×2): via INTRAVENOUS

## 2018-05-17 MED ORDER — INSULIN ASPART 100 UNIT/ML ~~LOC~~ SOLN
0.0000 [IU] | Freq: Three times a day (TID) | SUBCUTANEOUS | Status: DC
  Administered 2018-05-17 – 2018-05-18 (×3): 2 [IU] via SUBCUTANEOUS
  Administered 2018-05-18: 1 [IU] via SUBCUTANEOUS
  Administered 2018-05-18: 2 [IU] via SUBCUTANEOUS
  Administered 2018-05-19: 1 [IU] via SUBCUTANEOUS
  Administered 2018-05-19: 2 [IU] via SUBCUTANEOUS

## 2018-05-17 MED ORDER — CARVEDILOL 12.5 MG PO TABS
12.5000 mg | ORAL_TABLET | Freq: Two times a day (BID) | ORAL | Status: DC
  Administered 2018-05-18 – 2018-05-19 (×3): 12.5 mg via ORAL
  Filled 2018-05-17 (×3): qty 1

## 2018-05-17 MED ORDER — SODIUM CHLORIDE 0.9 % IV SOLN
2.0000 g | INTRAVENOUS | Status: DC
  Administered 2018-05-17: 2 g via INTRAVENOUS
  Filled 2018-05-17: qty 2

## 2018-05-17 NOTE — Progress Notes (Signed)
PHARMACY - PHYSICIAN COMMUNICATION CRITICAL VALUE ALERT - BLOOD CULTURE IDENTIFICATION (BCID)  Jim Bowers is an 78 y.o. 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  Assessment:  Klebsiella bacteremia, likely urinary source (include suspected source if known)  Name of physician (or Provider) Contacted: Dr. Florene Glen  Current antibiotics: cefepime 1g IV q12h  Changes to prescribed antibiotics recommended:  Recommendations accepted by provider - narrow to ceftriaxone 2g IV q24h  Results for orders placed or performed during the hospital encounter of 05/16/18  Blood Culture ID Panel (Reflexed) (Collected: 05/16/2018  3:52 PM)  Result Value Ref Range   Enterococcus species NOT DETECTED NOT DETECTED   Listeria monocytogenes NOT DETECTED NOT DETECTED   Staphylococcus species NOT DETECTED NOT DETECTED   Staphylococcus aureus (BCID) NOT DETECTED NOT DETECTED   Streptococcus species NOT DETECTED NOT DETECTED   Streptococcus agalactiae NOT DETECTED NOT DETECTED   Streptococcus pneumoniae NOT DETECTED NOT DETECTED   Streptococcus pyogenes NOT DETECTED NOT DETECTED   Acinetobacter baumannii NOT DETECTED NOT DETECTED   Enterobacteriaceae species DETECTED (A) NOT DETECTED   Enterobacter cloacae complex NOT DETECTED NOT DETECTED   Escherichia coli NOT DETECTED NOT DETECTED   Klebsiella oxytoca DETECTED (A) NOT DETECTED   Klebsiella pneumoniae NOT DETECTED NOT DETECTED   Proteus species NOT DETECTED NOT DETECTED   Serratia marcescens NOT DETECTED NOT DETECTED   Carbapenem resistance NOT DETECTED NOT DETECTED   Haemophilus influenzae NOT DETECTED NOT DETECTED   Neisseria meningitidis NOT DETECTED NOT DETECTED   Pseudomonas aeruginosa NOT DETECTED NOT DETECTED   Candida albicans NOT DETECTED NOT DETECTED   Candida glabrata NOT DETECTED NOT  DETECTED   Candida krusei NOT DETECTED NOT DETECTED   Candida parapsilosis NOT DETECTED NOT DETECTED   Candida tropicalis NOT DETECTED NOT DETECTED    Peggyann Juba, PharmD, BCPS Pager: [phone] 05/17/2018  5:13 PM

## 2018-05-17 NOTE — Plan of Care (Signed)
  Problem: Education: Goal: Knowledge of medication regimen will be met for pain relief regimen by discharge Outcome: Progressing   Problem: Education: Goal: Understanding of ways to prevent infection will improve by discharge Outcome: Progressing   Problem: Physical Regulation: Goal: Hemodynamic stability will return to baseline for the patient by discharge Outcome: Progressing Goal: Diagnostic test results will improve Outcome: Progressing Goal: Will remain free from infection Outcome: Progressing

## 2018-05-17 NOTE — Progress Notes (Addendum)
PROGRESS NOTE    Jim Bowers  EQA:[id number] DOB: 02-02-1941 DOA: 05/16/2018 PCP: Thomes Dinning, MD   Brief Narrative:  Jim Bowers is Jim Bowers 78 y.o. 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 Temp measured at home was 100.22F.  Patient reported seeing dark urine output in his urostomy bag yesterday evening that lightened in color with increased fluid intake. Otherwise patient has not seen any sediment or blood in urine output. Patient reports changing urostomy bag every 3-4 days as directed.   Last year, he had similar symptoms (bilateral flank pain, malaise) but did not seek medical care until he became altered and much sicker -- was ultimately admitted to ICU at OSH in [facility] for several days for urosepsis.    Assessment & Plan:   Active Problems:   UTI (urinary tract infection)   # Klebsiella Oxytoca Bacteremia 2/2 Urinary Tract Infection in setting of ileal conduit/urostomy - transition to ceftriaxone 2 grams daily, appreciate pharmacy assistance - follow final blood and urine cx - continue IVF - follow fever curve (last fever 101 1/20) - Follow leukocytosis  # AKI with Cr baseline 1.1-1.2 (I can't find this in records or care everywhere)             - continue IVF             - hold home losartan and metformin             - avoid NSAIDs and other nephrotoxic agents             - monitor with daily BMP  # Hx of CAD s/p remote MI and PCI - resume home aspirin and atorvastatin tomorrow  # Hx of HTN  - resume home coreg and hold losartan   # Hx of gout - no flare at this time - resume allopurinol 300mg  daily  # Hx of reflux - resume pepcid  # Hx of neuropathic pain - resume gabapentin 300mg  nightly  # DMT2 - not on insulin - hold metformin and sitagliptin while inpatient - fingersticks ACHS and low dose  SSI with meals  DVT prophylaxis: lovenox Code Status: full code Family Communication: none at bedside Disposition Plan: pending further improvement   Consultants:   none  Procedures:   none  Antimicrobials:  Anti-infectives (From admission, onward)   Start     Dose/Rate Route Frequency Ordered Stop   05/17/18 2200  cefTRIAXone (ROCEPHIN) 2 g in sodium chloride 0.9 % 100 mL IVPB     2 g 200 mL/hr over 30 Minutes Intravenous Every 24 hours 05/17/18 1713     05/16/18 2330  ceFEPIme (MAXIPIME) 1 g in sodium chloride 0.9 % 100 mL IVPB  Status:  Discontinued     1 g 200 mL/hr over 30 Minutes Intravenous 2 times daily 05/16/18 2319 05/17/18 1713   05/16/18 1715  cefTRIAXone (ROCEPHIN) 1 g in sodium chloride 0.9 % 100 mL IVPB     1 g 200 mL/hr over 30 Minutes Intravenous  Once 05/16/18 1706 05/16/18 1803         Subjective: Feeling better this AM than he did last night. Noted fever and back pain prior to coming in  Objective: Vitals:   05/17/18 0456 05/17/18 0517 05/17/18 0538 05/17/18 1511  BP: 125/76 (!) 125/45 129/71 118/71  Pulse: (!) 111 Marland Kitchen)  107 (!) 105 93  Resp: 20 18 18 16   Temp: (!) 102.5 F (39.2 C) (!) 101 F (38.3 C) 99 F (37.2 C) 99.3 F (37.4 C)  TempSrc: Oral Oral Oral Oral  SpO2: 97% 97% 99% 97%  Weight:      Height:        Intake/Output Summary (Last 24 hours) at 05/17/2018 1817 Last data filed at 05/17/2018 1534 Gross per 24 hour  Intake 706.23 ml  Output 3000 ml  Net -2293.77 ml   Filed Weights   05/16/18 1450  Weight: 81.6 kg    Examination:  General exam: Appears calm and comfortable  Respiratory system: Clear to auscultation. Respiratory effort normal. Cardiovascular system: S1 & S2 heard, RRR. Gastrointestinal system: Abdomen is nondistended, soft and nontender.  Urostomy present. No CVA tenderness Central nervous system: Alert and oriented. No focal neurological deficits. Extremities: no lee Skin: No rashes, lesions or  ulcers Psychiatry: Judgement and insight appear normal. Mood & affect appropriate.     Data Reviewed: I have personally reviewed following labs and imaging studies  CBC: Recent Labs  Lab 05/16/18 1558 05/17/18 0525  WBC 16.8* 15.1*  NEUTROABS 14.4* 13.4*  HGB 13.1 11.7*  HCT 40.4 37.0*  MCV 84.2 86.7  PLT 240 009   Basic Metabolic Panel: Recent Labs  Lab 05/16/18 1558 05/17/18 0525  NA 135 139  K 3.8 3.8  CL 105 112*  CO2 21* 18*  GLUCOSE 182* 203*  BUN 27* 27*  CREATININE 1.33* 1.30*  CALCIUM 8.9 8.6*  MG  --  1.9   GFR: Estimated Creatinine Clearance: 48.7 mL/min (Jim Bowers) (by C-G formula based on SCr of 1.3 mg/dL (H)). Liver Function Tests: Recent Labs  Lab 05/16/18 1558  AST 12*  ALT 14  ALKPHOS 70  BILITOT 1.2  PROT 6.7  ALBUMIN 3.3*   No results for input(s): LIPASE, AMYLASE in the last 168 hours. No results for input(s): AMMONIA in the last 168 hours. Coagulation Profile: No results for input(s): INR, PROTIME in the last 168 hours. Cardiac Enzymes: No results for input(s): CKTOTAL, CKMB, CKMBINDEX, TROPONINI in the last 168 hours. BNP (last 3 results) No results for input(s): PROBNP in the last 8760 hours. HbA1C: No results for input(s): HGBA1C in the last 72 hours. CBG: Recent Labs  Lab 05/17/18 0039 05/17/18 0753 05/17/18 1158 05/17/18 1736  GLUCAP 203* 164* 163* 157*   Lipid Profile: No results for input(s): CHOL, HDL, LDLCALC, TRIG, CHOLHDL, LDLDIRECT in the last 72 hours. Thyroid Function Tests: No results for input(s): TSH, T4TOTAL, FREET4, T3FREE, THYROIDAB in the last 72 hours. Anemia Panel: No results for input(s): VITAMINB12, FOLATE, FERRITIN, TIBC, IRON, RETICCTPCT in the last 72 hours. Sepsis Labs: Recent Labs  Lab 05/16/18 1606  LATICACIDVEN 1.23    Recent Results (from the past 240 hour(s))  Blood Culture (routine x 2)     Status: None (Preliminary result)   Collection Time: 05/16/18  3:42 PM  Result Value Ref Range  Status   Specimen Description   Final    BLOOD RIGHT ANTECUBITAL Performed at [facility], [address] [place], [place] [address]    Special Requests   Final    BOTTLES DRAWN AEROBIC AND ANAEROBIC Blood Culture adequate volume Performed at [facility], [address] [place], [place] [address]    Culture   Final    NO GROWTH < 24 HOURS Performed at [facility], [place] [address]  [place], [place] [address]    Report Status PENDING  Incomplete  Blood Culture (routine x 2)     Status: None (Preliminary result)   Collection Time: 05/16/18  3:52 PM  Result Value Ref Range Status   Specimen Description   Final    BLOOD LEFT ANTECUBITAL Performed at [facility], [address] [place], [place] [address]    Special Requests   Final    BOTTLES DRAWN AEROBIC AND ANAEROBIC Blood Culture adequate volume Performed at [facility], [address] [place], [place] [address]    Culture  Setup Time   Final    GRAM NEGATIVE RODS AEROBIC BOTTLE ONLY Organism ID to follow CRITICAL RESULT CALLED TO, READ BACK BY AND VERIFIED WITH: Seleta Rhymes PharmD 17:10 05/17/18 ([email or web address])    Culture   Final    NO GROWTH < 24 HOURS Performed at [facility], [place] [address], [place], [place] [address]    Report Status PENDING  Incomplete  Blood Culture ID Panel (Reflexed)     Status: Abnormal   Collection Time: 05/16/18  3:52 PM  Result Value Ref Range Status   Enterococcus species NOT DETECTED NOT DETECTED Final   Listeria monocytogenes NOT DETECTED NOT DETECTED Final   Staphylococcus species NOT DETECTED NOT DETECTED Final   Staphylococcus aureus (BCID) NOT DETECTED NOT DETECTED Final   Streptococcus species NOT DETECTED NOT DETECTED Final   Streptococcus agalactiae NOT DETECTED NOT DETECTED Final   Streptococcus pneumoniae NOT DETECTED NOT DETECTED Final   Streptococcus pyogenes NOT DETECTED NOT DETECTED Final   Acinetobacter baumannii NOT  DETECTED NOT DETECTED Final   Enterobacteriaceae species DETECTED (Shaun Runyon) NOT DETECTED Final    Comment: Enterobacteriaceae represent Doha Boling large family of gram-negative bacteria, not Izela Altier single organism. CRITICAL RESULT CALLED TO, READ BACK BY AND VERIFIED WITH: Seleta Rhymes PharmD 17:10 05/17/18 ([email or web address])    Enterobacter cloacae complex NOT DETECTED NOT DETECTED Final   Escherichia coli NOT DETECTED NOT DETECTED Final   Klebsiella oxytoca DETECTED (Marigene Erler) NOT DETECTED Final    Comment: CRITICAL RESULT CALLED TO, READ BACK BY AND VERIFIED WITH: Seleta Rhymes PharmD 17:10 05/17/18 ([email or web address])    Klebsiella pneumoniae NOT DETECTED NOT DETECTED Final   Proteus species NOT DETECTED NOT DETECTED Final   Serratia marcescens NOT DETECTED NOT DETECTED Final   Carbapenem resistance NOT DETECTED NOT DETECTED Final   Haemophilus influenzae NOT DETECTED NOT DETECTED Final   Neisseria meningitidis NOT DETECTED NOT DETECTED Final   Pseudomonas aeruginosa NOT DETECTED NOT DETECTED Final   Candida albicans NOT DETECTED NOT DETECTED Final   Candida glabrata NOT DETECTED NOT DETECTED Final   Candida krusei NOT DETECTED NOT DETECTED Final   Candida parapsilosis NOT DETECTED NOT DETECTED Final   Candida tropicalis NOT DETECTED NOT DETECTED Final    Comment: Performed at [facility], [place] [address] [place], [place] [address]         Radiology Studies: Dg Chest 2 View  Result Date: 05/16/2018 CLINICAL DATA:  Fever and back pain EXAM: CHEST - 2 VIEW COMPARISON:  None. FINDINGS: Cardiac shadows within normal limits. Diffuse aortic calcifications are noted. The lungs are clear. Mild degenerative change of the thoracic spine is noted. IMPRESSION: No acute abnormality noted. Electronically Signed   By: Inez Catalina M.D.   On: 05/16/2018 16:19        Scheduled Meds: . allopurinol  300 mg Oral Daily  . aspirin EC  81 mg Oral  Daily  . atorvastatin  20 mg Oral q1800  . clopidogrel  75 mg Oral Daily  . enoxaparin  (LOVENOX) injection  40 mg Subcutaneous Q24H  . famotidine  20 mg Oral QHS  . gabapentin  300 mg Oral QHS  . insulin aspart  0-9 Units Subcutaneous TID WC   Continuous Infusions: . sodium chloride Stopped (05/16/18 1837)  . cefTRIAXone (ROCEPHIN)  IV       LOS: 1 day    Time spent: over 30 min    Fayrene Helper, MD [facility] Pager AMION  If 7PM-7AM, please contact night-coverage [email or web address] Password [facility] 05/17/2018, 6:17 PM

## 2018-05-17 NOTE — Progress Notes (Signed)
Pt. transporterd from [place] via carelink,Pt.A&Ox4.Pt. Admitting physician contacted and pt.med orders released.Pt. orientated to the room and to call lights. Pt. demonstrated accordingly.Pt. rested comfortably and will monitor for the rest of the night.

## 2018-05-17 NOTE — Progress Notes (Signed)
Pt. Temp is high 102.5 and Tylenol given and notify the physician.Recheck  101 and later 99.

## 2018-05-18 DIAGNOSIS — R7881 Bacteremia: Secondary | ICD-10-CM

## 2018-05-18 LAB — GLUCOSE, CAPILLARY
Glucose-Capillary: 165 mg/dL — ABNORMAL HIGH (ref 70–99)
Glucose-Capillary: 189 mg/dL — ABNORMAL HIGH (ref 70–99)
Glucose-Capillary: 122 mg/dL — ABNORMAL HIGH (ref 70–99)
Glucose-Capillary: 250 mg/dL — ABNORMAL HIGH (ref 70–99)

## 2018-05-18 LAB — BASIC METABOLIC PANEL
Anion gap: 8 (ref 5–15)
BUN: 24 mg/dL — AB (ref 8–23)
CO2: 22 mmol/L (ref 22–32)
Calcium: 8.8 mg/dL — ABNORMAL LOW (ref 8.9–10.3)
Chloride: 111 mmol/L (ref 98–111)
Creatinine, Ser: 1.18 mg/dL (ref 0.61–1.24)
GFR calc Af Amer: >60 mL/min (ref >60)
GFR calc non Af Amer: 59 mL/min — ABNORMAL LOW (ref >60)
Glucose, Bld: 158 mg/dL — ABNORMAL HIGH (ref 70–99)
Potassium: 3.9 mmol/L (ref 3.5–5.1)
Sodium: 141 mmol/L (ref 135–145)

## 2018-05-18 LAB — CBC WITH DIFFERENTIAL/PLATELET
Abs Immature Granulocytes: 0.06 10*3/uL (ref 0.00–0.07)
Basophils Absolute: 0 10*3/uL (ref 0.0–0.1)
Basophils Relative: 0 %
Eosinophils Absolute: 0.2 10*3/uL (ref 0.0–0.5)
Eosinophils Relative: 2 %
HCT: 36.7 % — ABNORMAL LOW (ref 39.0–52.0)
Hemoglobin: 11.4 g/dL — ABNORMAL LOW (ref 13.0–17.0)
Immature Granulocytes: 1 %
LYMPHS ABS: 0.9 10*3/uL (ref 0.7–4.0)
Lymphocytes Relative: 8 %
MCH: 26.6 pg (ref 26.0–34.0)
MCHC: 31.1 g/dL (ref 30.0–36.0)
MCV: 85.7 fL (ref 80.0–100.0)
Monocytes Absolute: 0.9 10*3/uL (ref 0.1–1.0)
Monocytes Relative: 9 %
Neutro Abs: 8.8 10*3/uL — ABNORMAL HIGH (ref 1.7–7.7)
Neutrophils Relative %: 80 %
Platelets: 206 10*3/uL (ref 150–400)
RBC: 4.28 MIL/uL (ref 4.22–5.81)
RDW: 14.9 % (ref 11.5–15.5)
WBC: 10.9 10*3/uL — ABNORMAL HIGH (ref 4.0–10.5)
nRBC: 0 % (ref 0.0–0.2)

## 2018-05-18 LAB — MAGNESIUM: Magnesium: 2.3 mg/dL (ref 1.7–2.4)

## 2018-05-18 MED ORDER — SODIUM CHLORIDE 0.9 % IV SOLN
2.0000 g | Freq: Two times a day (BID) | INTRAVENOUS | Status: DC
  Administered 2018-05-18 – 2018-05-19 (×3): 2 g via INTRAVENOUS
  Filled 2018-05-18 (×3): qty 2

## 2018-05-18 NOTE — Progress Notes (Signed)
PROGRESS NOTE    Jim Bowers  BOF:[id number] DOB: 1940-12-05 DOA: 05/16/2018 PCP: Thomes Dinning, MD   Brief Narrative:  Jim Bowers is Jim Bowers 78 y.o. 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 Temp measured at home was 100.50F.  Patient reported seeing dark urine output in his urostomy bag yesterday evening that lightened in color with increased fluid intake. Otherwise patient has not seen any sediment or blood in urine output. Patient reports changing urostomy bag every 3-4 days as directed.   Last year, he had similar symptoms (bilateral flank pain, malaise) but did not seek medical care until he became altered and much sicker -- was ultimately admitted to ICU at OSH in ?[facility] for several days for urosepsis.    He was admitted for sepsis 2/2 klebsiella oxytoca bacteremia due to Jim Bowers UTI.  He's improved on cefepime.  Sensitivities pending.  Assessment & Plan:   Principal Problem:   Bacteremia due to Gram-negative bacteria Active Problems:   UTI (urinary tract infection)   # Klebsiella Oxytoca Bacteremia 2/2 Urinary Tract Infection in setting of ileal conduit/urostomy - changed back to cefepime based on care everywhere sensitivities in conversation with pharmacy - follow final blood and urine cx - continue IVF - follow fever curve (last fever 101 1/20) - Follow leukocytosis  # AKI with Cr baseline 1.1-1.2 (I can't find this in records or care everywhere)             - continue IVF             - hold home losartan and metformin             - avoid NSAIDs and other nephrotoxic agents             - monitor with daily BMP  # Hx of CAD s/p remote MI and PCI - resume home aspirin and atorvastatin   # Hx of HTN  - resume home coreg and hold losartan   # Hx of gout - no flare at this time - resume allopurinol 300mg   daily  # Hx of reflux - resume pepcid  # Hx of neuropathic pain - resume gabapentin 300mg  nightly  # DMT2 - not on insulin - hold metformin and sitagliptin while inpatient - fingersticks ACHS and low dose SSI with meals  DVT prophylaxis: lovenox Code Status: full code Family Communication: family at bedside Disposition Plan: pending further improvement   Consultants:   none  Procedures:   none  Antimicrobials:  Anti-infectives (From admission, onward)   Start     Dose/Rate Route Frequency Ordered Stop   05/18/18 0830  ceFEPIme (MAXIPIME) 2 g in sodium chloride 0.9 % 100 mL IVPB     2 g 200 mL/hr over 30 Minutes Intravenous Every 12 hours 05/18/18 0806     05/17/18 2200  cefTRIAXone (ROCEPHIN) 2 g in sodium chloride 0.9 % 100 mL IVPB  Status:  Discontinued     2 g 200 mL/hr over 30 Minutes Intravenous Every 24 hours 05/17/18 1713 05/18/18 0806   05/16/18 2330  ceFEPIme (MAXIPIME) 1 g in sodium chloride 0.9 % 100 mL IVPB  Status:  Discontinued     1 g 200 mL/hr over 30 Minutes Intravenous 2 times daily 05/16/18 2319 05/17/18 1713   05/16/18 1715  cefTRIAXone (ROCEPHIN) 1 g in sodium chloride  0.9 % 100 mL IVPB     1 g 200 mL/hr over 30 Minutes Intravenous  Once 05/16/18 1706 05/16/18 1803         Subjective: Feeling progressively better  Objective: Vitals:   05/17/18 1511 05/17/18 2022 05/18/18 0517 05/18/18 1344  BP: 118/71 118/70 125/77 129/70  Pulse: 93 85 73 81  Resp: 16 (!) 24 16   Temp: 99.3 F (37.4 C) 99.2 F (37.3 C) 97.6 F (36.4 C) 98.7 F (37.1 C)  TempSrc: Oral   Oral  SpO2: 97% 100% 99% 100%  Weight:      Height:        Intake/Output Summary (Last 24 hours) at 05/18/2018 1744 Last data filed at 05/18/2018 1735 Gross per 24 hour  Intake 1061.86 ml  Output 1570 ml  Net -508.14 ml   Filed Weights   05/16/18 1450  Weight: 81.6 kg    Examination:  General: No acute distress. Cardiovascular: Heart sounds show Tareka Jhaveri regular rate, and  rhythm. Lungs: Clear to auscultation bilaterally Abdomen: Soft, nontender, nondistended  Neurological: Alert and oriented 3. Moves all extremities 4. Cranial nerves II through XII grossly intact. Skin: Warm and dry. No rashes or lesions. Extremities: No clubbing or cyanosis. No edema.  Psychiatric: Mood and affect are normal. Insight and judgment are appropriate.   Data Reviewed: I have personally reviewed following labs and imaging studies  CBC: Recent Labs  Lab 05/16/18 1558 05/17/18 0525 05/18/18 0509  WBC 16.8* 15.1* 10.9*  NEUTROABS 14.4* 13.4* 8.8*  HGB 13.1 11.7* 11.4*  HCT 40.4 37.0* 36.7*  MCV 84.2 86.7 85.7  PLT 240 192 235   Basic Metabolic Panel: Recent Labs  Lab 05/16/18 1558 05/17/18 0525 05/18/18 0509  NA 135 139 141  K 3.8 3.8 3.9  CL 105 112* 111  CO2 21* 18* 22  GLUCOSE 182* 203* 158*  BUN 27* 27* 24*  CREATININE 1.33* 1.30* 1.18  CALCIUM 8.9 8.6* 8.8*  MG  --  1.9 2.3   GFR: Estimated Creatinine Clearance: 53.6 mL/min (by C-G formula based on SCr of 1.18 mg/dL). Liver Function Tests: Recent Labs  Lab 05/16/18 1558  AST 12*  ALT 14  ALKPHOS 70  BILITOT 1.2  PROT 6.7  ALBUMIN 3.3*   No results for input(s): LIPASE, AMYLASE in the last 168 hours. No results for input(s): AMMONIA in the last 168 hours. Coagulation Profile: No results for input(s): INR, PROTIME in the last 168 hours. Cardiac Enzymes: No results for input(s): CKTOTAL, CKMB, CKMBINDEX, TROPONINI in the last 168 hours. BNP (last 3 results) No results for input(s): PROBNP in the last 8760 hours. HbA1C: No results for input(s): HGBA1C in the last 72 hours. CBG: Recent Labs  Lab 05/17/18 1736 05/17/18 2024 05/18/18 0726 05/18/18 1119 05/18/18 1635  GLUCAP 157* 225* 165* 189* 122*   Lipid Profile: No results for input(s): CHOL, HDL, LDLCALC, TRIG, CHOLHDL, LDLDIRECT in the last 72 hours. Thyroid Function Tests: No results for input(s): TSH, T4TOTAL, FREET4, T3FREE,  THYROIDAB in the last 72 hours. Anemia Panel: No results for input(s): VITAMINB12, FOLATE, FERRITIN, TIBC, IRON, RETICCTPCT in the last 72 hours. Sepsis Labs: Recent Labs  Lab 05/16/18 1606  LATICACIDVEN 1.23    Recent Results (from the past 240 hour(s))  Blood Culture (routine x 2)     Status: None (Preliminary result)   Collection Time: 05/16/18  3:42 PM  Result Value Ref Range Status   Specimen Description   Final    BLOOD RIGHT  ANTECUBITAL Performed at [facility], [address] [place], [place] [address]    Special Requests   Final    BOTTLES DRAWN AEROBIC AND ANAEROBIC Blood Culture adequate volume Performed at [facility], [address] [place], [place] [address]    Culture   Final    NO GROWTH 2 DAYS Performed at [facility], [place] [address], [place], [place] [address]    Report Status PENDING  Incomplete  Blood Culture (routine x 2)     Status: Abnormal (Preliminary result)   Collection Time: 05/16/18  3:52 PM  Result Value Ref Range Status   Specimen Description   Final    BLOOD LEFT ANTECUBITAL Performed at [facility] [address] [place], [place] [address]    Special Requests   Final    BOTTLES DRAWN AEROBIC AND ANAEROBIC Blood Culture adequate volume Performed at [facility], [address] [place], [place] [address]    Culture  Setup Time   Final    GRAM NEGATIVE RODS AEROBIC BOTTLE ONLY CRITICAL RESULT CALLED TO, READ BACK BY AND VERIFIED WITH: Seleta Rhymes PharmD 17:10 05/17/18 ([email or web address])    Culture (Laren Whaling)  Final    KLEBSIELLA OXYTOCA SUSCEPTIBILITIES TO FOLLOW Performed at [facility], [address], [place], [place] [address]    Report Status PENDING  Incomplete  Blood Culture ID Panel (Reflexed)     Status: Abnormal   Collection Time: 05/16/18  3:52 PM  Result Value Ref Range Status   Enterococcus species NOT DETECTED NOT DETECTED Final   Listeria monocytogenes NOT DETECTED NOT  DETECTED Final   Staphylococcus species NOT DETECTED NOT DETECTED Final   Staphylococcus aureus (BCID) NOT DETECTED NOT DETECTED Final   Streptococcus species NOT DETECTED NOT DETECTED Final   Streptococcus agalactiae NOT DETECTED NOT DETECTED Final   Streptococcus pneumoniae NOT DETECTED NOT DETECTED Final   Streptococcus pyogenes NOT DETECTED NOT DETECTED Final   Acinetobacter baumannii NOT DETECTED NOT DETECTED Final   Enterobacteriaceae species DETECTED (Nayden Czajka) NOT DETECTED Final    Comment: Enterobacteriaceae represent Cia Garretson large family of gram-negative bacteria, not Kathlynn Swofford single organism. CRITICAL RESULT CALLED TO, READ BACK BY AND VERIFIED WITH: Seleta Rhymes PharmD 17:10 05/17/18 ([email or web address])    Enterobacter cloacae complex NOT DETECTED NOT DETECTED Final   Escherichia coli NOT DETECTED NOT DETECTED Final   Klebsiella oxytoca DETECTED (Coleby Yett) NOT DETECTED Final    Comment: CRITICAL RESULT CALLED TO, READ BACK BY AND VERIFIED WITH: Seleta Rhymes PharmD 17:10 05/17/18 ([email or web address])    Klebsiella pneumoniae NOT DETECTED NOT DETECTED Final   Proteus species NOT DETECTED NOT DETECTED Final   Serratia marcescens NOT DETECTED NOT DETECTED Final   Carbapenem resistance NOT DETECTED NOT DETECTED Final   Haemophilus influenzae NOT DETECTED NOT DETECTED Final   Neisseria meningitidis NOT DETECTED NOT DETECTED Final   Pseudomonas aeruginosa NOT DETECTED NOT DETECTED Final   Candida albicans NOT DETECTED NOT DETECTED Final   Candida glabrata NOT DETECTED NOT DETECTED Final   Candida krusei NOT DETECTED NOT DETECTED Final   Candida parapsilosis NOT DETECTED NOT DETECTED Final   Candida tropicalis NOT DETECTED NOT DETECTED Final    Comment: Performed at [facility], [place] [address], [place], [place] [address]  Urine Culture     Status: Abnormal (Preliminary result)   Collection Time: 05/16/18  4:14 PM  Result Value Ref Range Status   Specimen Description  Final    URINE, RANDOM Performed at [facility], [address] [place], [place] [address]    Special Requests   Final    NONE Performed at [facility], [address] [place], [place] [address]    Culture >=100,000 COLONIES/mL KLEBSIELLA OXYTOCA (Ysela Hettinger)  Final   Report Status PENDING  Incomplete         Radiology Studies: No results found.      Scheduled Meds: . allopurinol  300 mg Oral Daily  . aspirin EC  81 mg Oral Daily  . atorvastatin  20 mg Oral q1800  . carvedilol  12.5 mg Oral BID WC  . clopidogrel  75 mg Oral Daily  . enoxaparin (LOVENOX) injection  40 mg Subcutaneous Q24H  . famotidine  20 mg Oral QHS  . gabapentin  300 mg Oral QHS  . insulin aspart  0-9 Units Subcutaneous TID WC   Continuous Infusions: . sodium chloride Stopped (05/16/18 1837)  . ceFEPime (MAXIPIME) IV 2 g (05/18/18 1043)  . lactated ringers 100 mL/hr at 05/18/18 0522     LOS: 2 days    Time spent: over 57 min    Fayrene Helper, MD [facility] Pager AMION  If 7PM-7AM, please contact night-coverage [email or web address] Password [facility] 05/18/2018, 5:44 PM

## 2018-05-19 LAB — URINE CULTURE: Culture: >=100000 — AB

## 2018-05-19 LAB — CBC WITH DIFFERENTIAL/PLATELET
Abs Immature Granulocytes: 0.04 10*3/uL (ref 0.00–0.07)
BASOS PCT: 0 %
Basophils Absolute: 0 10*3/uL (ref 0.0–0.1)
Eosinophils Absolute: 0.2 10*3/uL (ref 0.0–0.5)
Eosinophils Relative: 2 %
HCT: 36 % — ABNORMAL LOW (ref 39.0–52.0)
Hemoglobin: 11.5 g/dL — ABNORMAL LOW (ref 13.0–17.0)
Immature Granulocytes: 1 %
Lymphocytes Relative: 10 %
Lymphs Abs: 0.7 10*3/uL (ref 0.7–4.0)
MCH: 26.7 pg (ref 26.0–34.0)
MCHC: 31.9 g/dL (ref 30.0–36.0)
MCV: 83.7 fL (ref 80.0–100.0)
Monocytes Absolute: 0.5 10*3/uL (ref 0.1–1.0)
Monocytes Relative: 8 %
Neutro Abs: 5.7 10*3/uL (ref 1.7–7.7)
Neutrophils Relative %: 79 %
Platelets: 192 10*3/uL (ref 150–400)
RBC: 4.3 MIL/uL (ref 4.22–5.81)
RDW: 14.6 % (ref 11.5–15.5)
WBC: 7.1 10*3/uL (ref 4.0–10.5)
nRBC: 0 % (ref 0.0–0.2)

## 2018-05-19 LAB — BASIC METABOLIC PANEL
Anion gap: 8 (ref 5–15)
BUN: 20 mg/dL (ref 8–23)
CALCIUM: 8.8 mg/dL — AB (ref 8.9–10.3)
CO2: 22 mmol/L (ref 22–32)
CREATININE: 1.05 mg/dL (ref 0.61–1.24)
Chloride: 112 mmol/L — ABNORMAL HIGH (ref 98–111)
GFR calc Af Amer: >60 mL/min (ref >60)
Glucose, Bld: 151 mg/dL — ABNORMAL HIGH (ref 70–99)
Potassium: 3.8 mmol/L (ref 3.5–5.1)
Sodium: 142 mmol/L (ref 135–145)

## 2018-05-19 LAB — CULTURE, BLOOD (ROUTINE X 2): Special Requests: ADEQUATE

## 2018-05-19 LAB — MAGNESIUM: MAGNESIUM: 2 mg/dL (ref 1.7–2.4)

## 2018-05-19 LAB — GLUCOSE, CAPILLARY
Glucose-Capillary: 144 mg/dL — ABNORMAL HIGH (ref 70–99)
Glucose-Capillary: 199 mg/dL — ABNORMAL HIGH (ref 70–99)

## 2018-05-19 MED ORDER — CEFDINIR 300 MG PO CAPS
300.0000 mg | ORAL_CAPSULE | Freq: Two times a day (BID) | ORAL | Status: DC
  Filled 2018-05-19: qty 1

## 2018-05-19 MED ORDER — CEFDINIR 300 MG PO CAPS
300.0000 mg | ORAL_CAPSULE | Freq: Two times a day (BID) | ORAL | Status: DC
  Administered 2018-05-19: 300 mg via ORAL
  Filled 2018-05-19: qty 1

## 2018-05-19 MED ORDER — CEFDINIR 300 MG PO CAPS: 300.0000 mg | ORAL_CAPSULE | Freq: Two times a day (BID) | ORAL | 0 refills | Status: AC

## 2018-05-19 NOTE — Discharge Summary (Signed)
[facility]  Physician Discharge Summary   Patient ID: Jim Bowers MRN: [id number] DOB/AGE: 1941/03/18 78 y.o.  Admit date: 05/16/2018 Discharge date: 05/19/2018  PCP: Thomes Dinning, MD  DISCHARGE DIAGNOSES:  Klebsiella oxytoca bacteremia and UTI This is in the setting of ileal conduit/urostomy Acute kidney injury, resolved History of coronary artery disease, stable Essential hypertension History of gout   RECOMMENDATIONS FOR OUTPATIENT FOLLOW UP: 1. 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 Temp measured at home was 100.23F. Patient reported seeing dark urine output in his urostomy bag yesterday evening that lightened in color with increased fluid intake. Otherwise patient has not seen any sediment or blood in urine output. Patient reports changing urostomy bag every 3-4 days as directed.   Last year, he had similar symptoms (bilateral flank pain, malaise) but did not seek medical care until he became altered and much sicker -- was ultimately admitted to ICU at [facility] for several days for urosepsis.   He was admitted for sepsis 2/2 klebsiella oxytoca bacteremia due to a UTI.  He's improved on cefepime.  Sensitivities pending.    HOSPITAL COURSE:   #Klebsiella Oxytoca Bacteremia 2/2 Urinary Tract Infection in setting of ileal conduit/urostomy Cultures reviewed.  Patient's WBC is now normal.  Patient was on cefepime.  Based on sensitivities he has been changed over to [facility].  Will be discharged on the same.  Will do a 10 day course of treatment.  Abdomen is benign on examination.  # AKIwith Cr Renal function  improved with IV hydration.  #Hx of CAD s/p remote MI and PCI Stable.  Continue home medications  #Hx of HTN Continue home medications.-resume home coreg and hold losartan   #Hx of gout Stable continue home medications.  #Hx of reflux Stable continue home medications.  #Hx of neuropathic pain -resume gabapentin 300mg nightly  #DMT2 Resume home medications.  [facility] for discharge home today.   PERTINENT LABS:  The results of significant diagnostics from this hospitalization (including imaging, microbiology, ancillary and laboratory) are listed below for reference.    Microbiology: Recent Results (from the past 240 hour(s))  Blood Culture (routine x 2)     Status: None (Preliminary result)   Collection Time: 05/16/18  3:42 PM  Result Value Ref Range Status   Specimen Description   Final    BLOOD RIGHT ANTECUBITAL Performed at [facility], [address] [place], [place] [address]    Special Requests   Final    BOTTLES DRAWN AEROBIC AND ANAEROBIC Blood Culture adequate volume Performed at [facility], [address] [place], [place] [address]    Culture   Final    NO GROWTH 3 DAYS Performed at [facility], [place] [address] [place], [place] [address]    Report Status PENDING  Incomplete  Blood Culture (routine x 2)     Status: Abnormal   Collection Time: 05/16/18  3:52 PM  Result Value Ref Range Status   Specimen Description   Final    BLOOD LEFT ANTECUBITAL Performed at [facility], [address] [place], [place] [address]  Special Requests   Final    BOTTLES DRAWN AEROBIC AND ANAEROBIC Blood Culture adequate volume Performed at [facility], [address] [place], [place] [address]    Culture  Setup Time   Final    GRAM NEGATIVE RODS AEROBIC BOTTLE ONLY CRITICAL RESULT CALLED TO, READ BACK BY AND VERIFIED WITH: Seleta Rhymes PharmD 17:10 05/17/18 ([email or web address]) Performed at [facility],  [address] [place], [place] [address]    Culture KLEBSIELLA OXYTOCA (A)  Final   Report Status 05/19/2018 FINAL  Final   Organism ID, Bacteria KLEBSIELLA OXYTOCA  Final      Susceptibility   Klebsiella oxytoca - MIC*    AMPICILLIN >=32 RESISTANT Resistant     CEFAZOLIN 8 SENSITIVE Sensitive     CEFEPIME <=1 SENSITIVE Sensitive     CEFTAZIDIME <=1 SENSITIVE Sensitive     CEFTRIAXONE <=1 SENSITIVE Sensitive     CIPROFLOXACIN <=0.25 SENSITIVE Sensitive     GENTAMICIN <=1 SENSITIVE Sensitive     IMIPENEM <=0.25 SENSITIVE Sensitive     TRIMETH/SULFA <=20 SENSITIVE Sensitive     AMPICILLIN/SULBACTAM 16 INTERMEDIATE Intermediate     PIP/TAZO <=4 SENSITIVE Sensitive     Extended ESBL NEGATIVE Sensitive     * KLEBSIELLA OXYTOCA  Blood Culture ID Panel (Reflexed)     Status: Abnormal   Collection Time: 05/16/18  3:52 PM  Result Value Ref Range Status   Enterococcus species NOT DETECTED NOT DETECTED Final   Listeria monocytogenes NOT DETECTED NOT DETECTED Final   Staphylococcus species NOT DETECTED NOT DETECTED Final   Staphylococcus aureus (BCID) NOT DETECTED NOT DETECTED Final   Streptococcus species NOT DETECTED NOT DETECTED Final   Streptococcus agalactiae NOT DETECTED NOT DETECTED Final   Streptococcus pneumoniae NOT DETECTED NOT DETECTED Final   Streptococcus pyogenes NOT DETECTED NOT DETECTED Final   Acinetobacter baumannii NOT DETECTED NOT DETECTED Final   Enterobacteriaceae species DETECTED (A) NOT DETECTED Final    Comment: Enterobacteriaceae represent a large family of gram-negative bacteria, not a single organism. CRITICAL RESULT CALLED TO, READ BACK BY AND VERIFIED WITH: Seleta Rhymes PharmD 17:10 05/17/18 ([email or web address])    Enterobacter cloacae complex NOT DETECTED NOT DETECTED Final   Escherichia coli NOT DETECTED NOT DETECTED Final   Klebsiella oxytoca DETECTED (A) NOT DETECTED Final    Comment: CRITICAL RESULT CALLED TO, READ BACK BY AND VERIFIED WITH: Seleta Rhymes PharmD 17:10  05/17/18 ([email or web address])    Klebsiella pneumoniae NOT DETECTED NOT DETECTED Final   Proteus species NOT DETECTED NOT DETECTED Final   Serratia marcescens NOT DETECTED NOT DETECTED Final   Carbapenem resistance NOT DETECTED NOT DETECTED Final   Haemophilus influenzae NOT DETECTED NOT DETECTED Final   Neisseria meningitidis NOT DETECTED NOT DETECTED Final   Pseudomonas aeruginosa NOT DETECTED NOT DETECTED Final   Candida albicans NOT DETECTED NOT DETECTED Final   Candida glabrata NOT DETECTED NOT DETECTED Final   Candida krusei NOT DETECTED NOT DETECTED Final   Candida parapsilosis NOT DETECTED NOT DETECTED Final   Candida tropicalis NOT DETECTED NOT DETECTED Final    Comment: Performed at [facility], [place] [address] [place], [place] [address]  Urine Culture     Status: Abnormal   Collection Time: 05/16/18  4:14 PM  Result Value Ref Range Status   Specimen Description   Final    URINE, RANDOM Performed at [facility], [address], [place], [place] [address]    Special Requests  Final    NONE Performed at [facility], [address] [place], [place] [address]    Culture >=100,000 COLONIES/mL KLEBSIELLA OXYTOCA (A)  Final   Report Status 05/19/2018 FINAL  Final   Organism ID, Bacteria KLEBSIELLA OXYTOCA (A)  Final      Susceptibility   Klebsiella oxytoca - MIC*    AMPICILLIN >=32 RESISTANT Resistant     CEFAZOLIN 8 SENSITIVE Sensitive     CEFTRIAXONE <=1 SENSITIVE Sensitive     CIPROFLOXACIN <=0.25 SENSITIVE Sensitive     GENTAMICIN <=1 SENSITIVE Sensitive     IMIPENEM <=0.25 SENSITIVE Sensitive     NITROFURANTOIN <=16 SENSITIVE Sensitive     TRIMETH/SULFA <=20 SENSITIVE Sensitive     AMPICILLIN/SULBACTAM 16 INTERMEDIATE Intermediate     PIP/TAZO <=4 SENSITIVE Sensitive     Extended ESBL NEGATIVE Sensitive     * >=100,000 COLONIES/mL KLEBSIELLA OXYTOCA     Labs: Basic Metabolic Panel: Recent Labs  Lab 05/16/18 1558 05/17/18 0525  05/18/18 0509 05/19/18 0532  NA 135 139 141 142  K 3.8 3.8 3.9 3.8  CL 105 112* 111 112*  CO2 21* 18* 22 22  GLUCOSE 182* 203* 158* 151*  BUN 27* 27* 24* 20  CREATININE 1.33* 1.30* 1.18 1.05  CALCIUM 8.9 8.6* 8.8* 8.8*  MG  --  1.9 2.3 2.0   Liver Function Tests: Recent Labs  Lab 05/16/18 1558  AST 12*  ALT 14  ALKPHOS 70  BILITOT 1.2  PROT 6.7  ALBUMIN 3.3*   CBC: Recent Labs  Lab 05/16/18 1558 05/17/18 0525 05/18/18 0509 05/19/18 0532  WBC 16.8* 15.1* 10.9* 7.1  NEUTROABS 14.4* 13.4* 8.8* 5.7  HGB 13.1 11.7* 11.4* 11.5*  HCT 40.4 37.0* 36.7* 36.0*  MCV 84.2 86.7 85.7 83.7  PLT 240 192 206 192    CBG: Recent Labs  Lab 05/18/18 1119 05/18/18 1635 05/18/18 2022 05/19/18 0737 05/19/18 1124  GLUCAP 189* 122* 250* 144* 199*     IMAGING STUDIES Dg Chest 2 View  Result Date: 05/16/2018 CLINICAL DATA:  Fever and back pain EXAM: CHEST - 2 VIEW COMPARISON:  None. FINDINGS: Cardiac shadows within normal limits. Diffuse aortic calcifications are noted. The lungs are clear. Mild degenerative change of the thoracic spine is noted. IMPRESSION: No acute abnormality noted. Electronically Signed   By: Inez Catalina M.D.   On: 05/16/2018 16:19    DISCHARGE EXAMINATION: Vitals:   05/18/18 1933 05/19/18 0455 05/19/18 0750 05/19/18 1320  BP: 135/75 102/61 138/87 140/87  Pulse: 79 74  71  Resp: 18 18    Temp: 99 F (37.2 C) 98.2 F (36.8 C)  97.6 F (36.4 C)  TempSrc: Oral Oral  Oral  SpO2: 98% 100%  98%  Weight:      Height:       General appearance: alert, cooperative, appears stated age and no distress Resp: clear to auscultation bilaterally Cardio: regular rate and rhythm, S1, S2 normal, no murmur, click, rub or gallop GI: soft, non-tender; bowel sounds normal; no masses,  no organomegaly Extremities: extremities normal, atraumatic, no cyanosis or edema Pulses: 2+ and symmetric   DISPOSITION: Home  Discharge Instructions    Call MD for:  difficulty  breathing, headache or visual disturbances   Complete by:  As directed    Call MD for:  extreme fatigue   Complete by:  As directed    Call MD for:  hives   Complete by:  As directed    Call  MD for:  persistant dizziness or light-headedness   Complete by:  As directed    Call MD for:  persistant nausea and vomiting   Complete by:  As directed    Call MD for:  severe uncontrolled pain   Complete by:  As directed    Call MD for:  temperature >100.4   Complete by:  As directed    Diet - low sodium heart healthy   Complete by:  As directed    Discharge instructions   Complete by:  As directed    Please take your antibiotics as prescribed.  Please follow-up with your primary care provider within 1 week.  Also follow-up with your urologist.  You were cared for by a hospitalist during your hospital stay. If you have any questions about your discharge medications or the care you received while you were in the hospital after you are discharged, you can call the unit and asked to speak with the hospitalist on call if the hospitalist that took care of you is not available. Once you are discharged, your primary care physician will handle any further medical issues. Please note that NO REFILLS for any discharge medications will be authorized once you are discharged, as it is imperative that you return to your primary care physician (or establish a relationship with a primary care physician if you do not have one) for your aftercare needs so that they can reassess your need for medications and monitor your lab values. If you do not have a primary care physician, you can call [phone] for a physician referral.   Increase activity slowly   Complete by:  As directed         Allergies as of 05/19/2018      Reactions   Codeine Swelling   Hyoscyamine Swelling   Throat swells, difficulty breathing   Glimepiride Other (See Comments)   Low bs levels   Pioglitazone Other (See Comments)   Pt has bladder  cancer   Colchicine Nausea Only   GI UPSET   Iodinated Diagnostic Agents Rash   Patient states shellfish allergy; no IV contrast allergy   Shellfish Allergy Rash      Medication List    TAKE these medications   allopurinol 300 MG tablet Commonly known as:  ZYLOPRIM Take 300 mg by mouth daily.   aspirin EC 81 MG tablet Take 81 mg by mouth every evening.   atorvastatin 20 MG tablet Commonly known as:  LIPITOR Take 20 mg by mouth every evening.   carvedilol 25 MG tablet Commonly known as:  COREG Take 12.5 mg by mouth 2 (two) times daily with a meal.   cefdinir 300 MG capsule Commonly known as:  OMNICEF Take 1 capsule (300 mg total) by mouth every 12 (twelve) hours for 7 days.   clopidogrel 75 MG tablet Commonly known as:  PLAVIX Take 75 mg by mouth daily after breakfast.   famotidine 20 MG tablet Commonly known as:  PEPCID Take 20 mg by mouth 2 (two) times daily.   gabapentin 300 MG capsule Commonly known as:  NEURONTIN Take 300 mg by mouth 2 (two) times daily.   ICAPS AREDS 2 PO Take 1 tablet by mouth 2 (two) times daily.   loperamide 2 MG capsule Commonly known as:  IMODIUM Take 2 mg by mouth every 8 (eight) hours as needed for diarrhea or loose stools.   losartan 50 MG tablet Commonly known as:  COZAAR Take 50 mg by mouth daily after  breakfast.   metFORMIN 500 MG 24 hr tablet Commonly known as:  GLUCOPHAGE-XR Take 500 mg by mouth every evening.   nitroGLYCERIN 0.4 MG SL tablet Commonly known as:  NITROSTAT Place 0.4 mg under the tongue every 5 (five) minutes as needed for chest pain.   nystatin powder Commonly known as:  MYCOSTATIN/NYSTOP Apply 1 g topically 2 (two) times daily as needed (irritation).   sitaGLIPtin 100 MG tablet Commonly known as:  JANUVIA Take 100 mg by mouth daily after breakfast.   traMADol 50 MG tablet Commonly known as:  ULTRAM Take 50 mg by mouth every 6 (six) hours as needed for moderate pain or severe pain.   vitamin  B-12 1000 MCG tablet Commonly known as:  CYANOCOBALAMIN Take 1,000 mcg by mouth every evening.        Follow-up Information    Thomes Dinning, MD. Schedule an appointment as soon as possible for a visit in 1 week(s).   Specialty:  Internal Medicine Contact information: [address] [place] [address] [place]: 35 mins  [place]  [facility] Pager on [email or web address]  05/19/2018, 3:11 PM

## 2018-05-19 NOTE — Discharge Instructions (Signed)
Urinary Tract Infection, Adult A urinary tract infection (UTI) is an infection of any part of the urinary tract. The urinary tract includes:  The kidneys.  The ureters.  The bladder.  The urethra. These organs make, store, and get rid of pee (urine) in the body. What are the causes? This is caused by germs (bacteria) in your genital area. These germs grow and cause swelling (inflammation) of your urinary tract. What increases the risk? You are more likely to develop this condition if:  You have a small, thin tube (catheter) to drain pee.  You cannot control when you pee or poop (incontinence).  You are male, and: ? You use these methods to prevent pregnancy: ? A medicine that kills sperm (spermicide). ? A device that blocks sperm (diaphragm). ? You have low levels of a male hormone (estrogen). ? You are pregnant.  You have genes that add to your risk.  You are sexually active.  You take antibiotic medicines.  You have trouble peeing because of: ? A prostate that is bigger than normal, if you are male. ? A blockage in the part of your body that drains pee from the bladder (urethra). ? A kidney stone. ? A nerve condition that affects your bladder (neurogenic bladder). ? Not getting enough to drink. ? Not peeing often enough.  You have other conditions, such as: ? Diabetes. ? A weak disease-fighting system (immune system). ? Sickle cell disease. ? Gout. ? Injury of the spine. What are the signs or symptoms? Symptoms of this condition include:  Needing to pee right away (urgently).  Peeing often.  Peeing small amounts often.  Pain or burning when peeing.  Blood in the pee.  Pee that smells bad or not like normal.  Trouble peeing.  Pee that is cloudy.  Fluid coming from the vagina, if you are male.  Pain in the belly or lower back. Other symptoms include:  Throwing up (vomiting).  No urge to eat.  Feeling mixed up (confused).  Being tired  and grouchy (irritable).  A fever.  Watery poop (diarrhea). How is this treated? This condition may be treated with:  Antibiotic medicine.  Other medicines.  Drinking enough water. Follow these instructions at home:  Medicines  Take over-the-counter and prescription medicines only as told by your doctor.  If you were prescribed an antibiotic medicine, take it as told by your doctor. Do not stop taking it even if you start to feel better. General instructions  Make sure you: ? Pee until your bladder is empty. ? Do not hold pee for a long time. ? Empty your bladder after sex. ? Wipe from front to back after pooping if you are a male. Use each tissue one time when you wipe.  Drink enough fluid to keep your pee pale yellow.  Keep all follow-up visits as told by your doctor. This is important. Contact a doctor if:  You do not get better after 1-2 days.  Your symptoms go away and then come back. Get help right away if:  You have very bad back pain.  You have very bad pain in your lower belly.  You have a fever.  You are sick to your stomach (nauseous).  You are throwing up. Summary  A urinary tract infection (UTI) is an infection of any part of the urinary tract.  This condition is caused by germs in your genital area.  There are many risk factors for a UTI. These include having a small, thin   tube to drain pee and not being able to control when you pee or poop.  Treatment includes antibiotic medicines for germs.  Drink enough fluid to keep your pee pale yellow. This information is not intended to replace advice given to you by your health care provider. Make sure you discuss any questions you have with your health care provider. Document Released: 10/01/2007 Document Revised: 10/22/2017 Document Reviewed: 10/22/2017 Elsevier Interactive Patient Education  2019 [employer] Inc[employer]  

## 2018-05-19 NOTE — Progress Notes (Signed)
Discharge instructions given to pt and all questions were answered. Pt taken down via wheelchair and was picked up by his wife. 

## 2018-05-21 LAB — CULTURE, BLOOD (ROUTINE X 2)
Culture: NO GROWTH
Special Requests: ADEQUATE

## 2020-02-14 IMAGING — CR DG CHEST 2V
2 series · 2 of 2 positions shown · non-contrast
Comparison: None.

CLINICAL DATA: Fever and back pain

EXAM:
CHEST - 2 VIEW

[w chest pa]
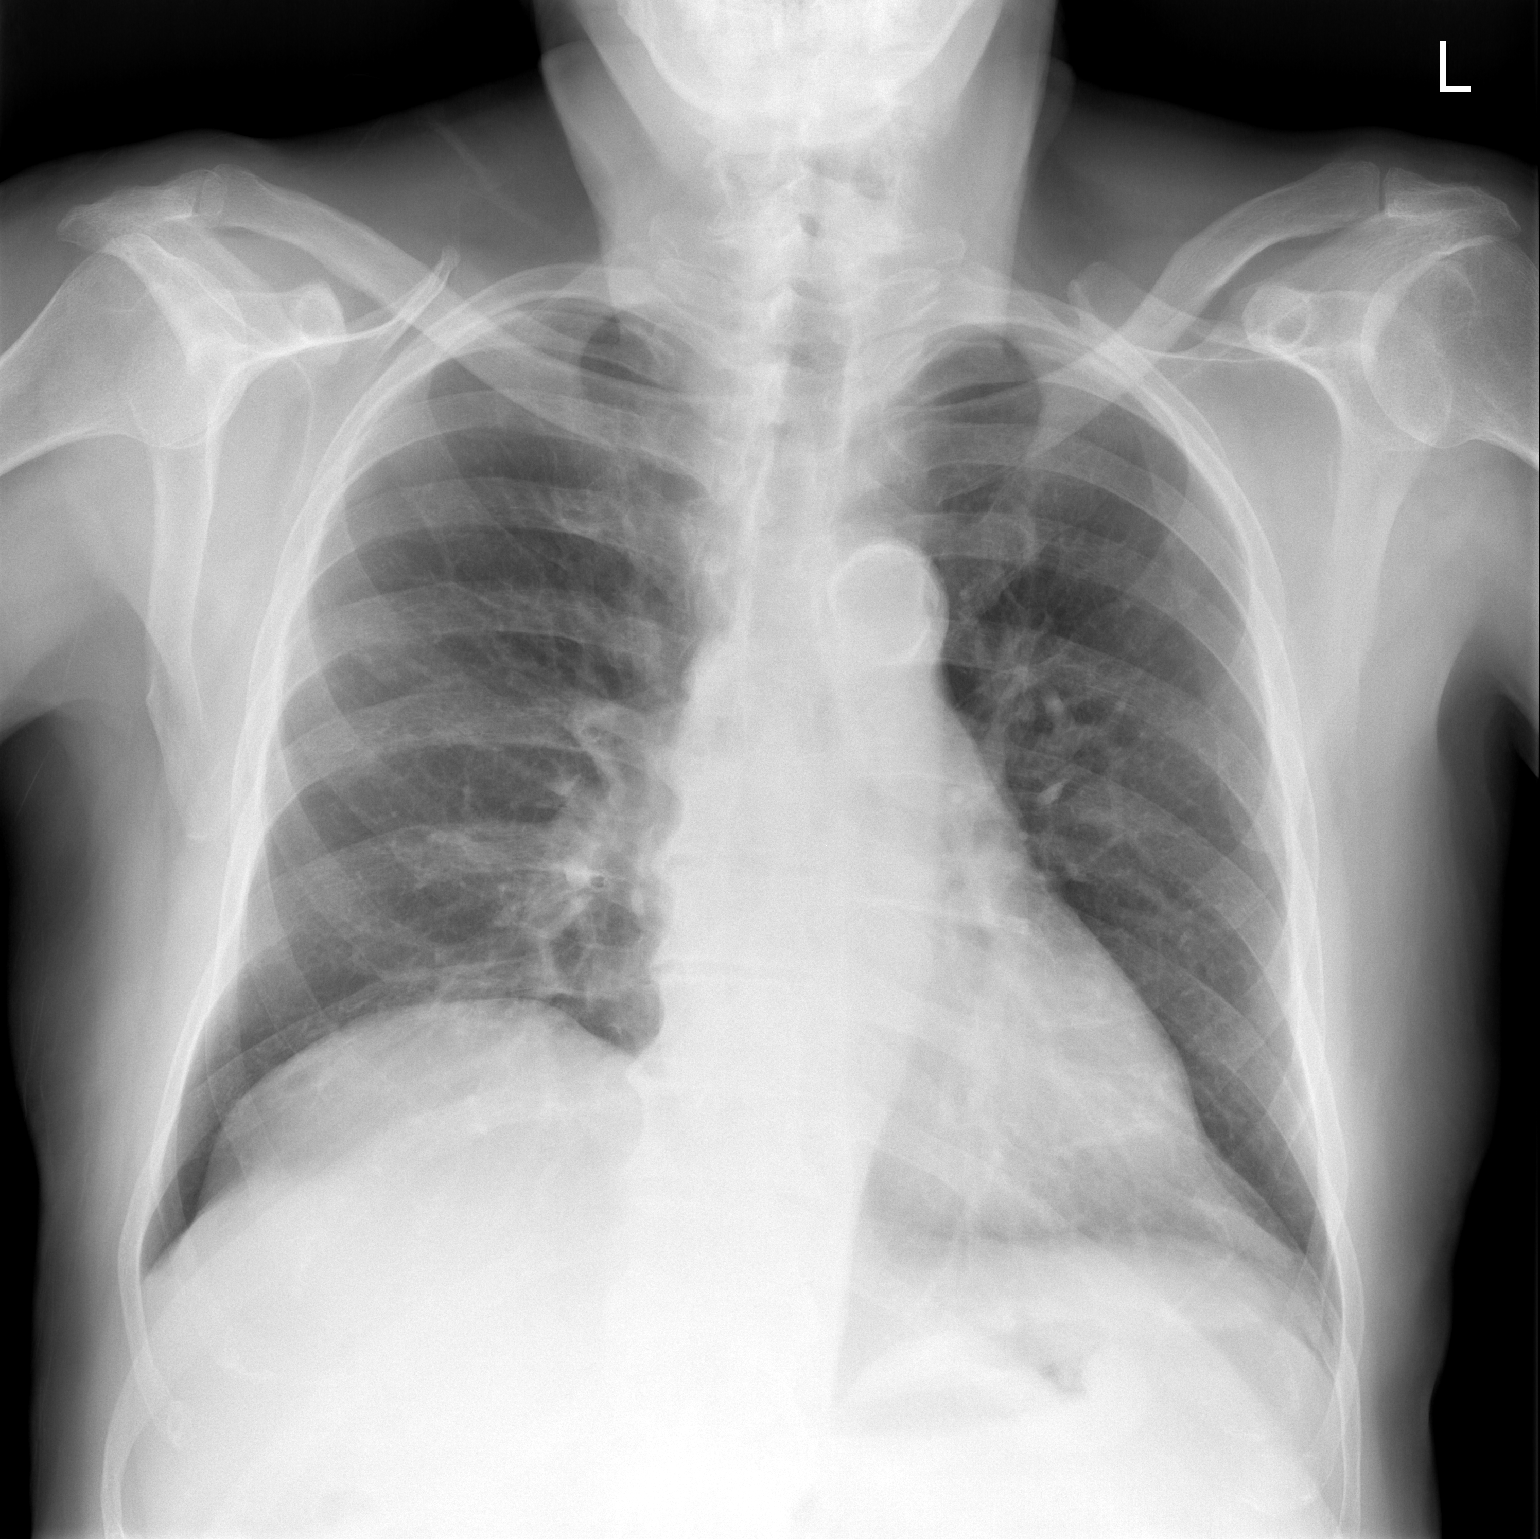

[w chest lat]
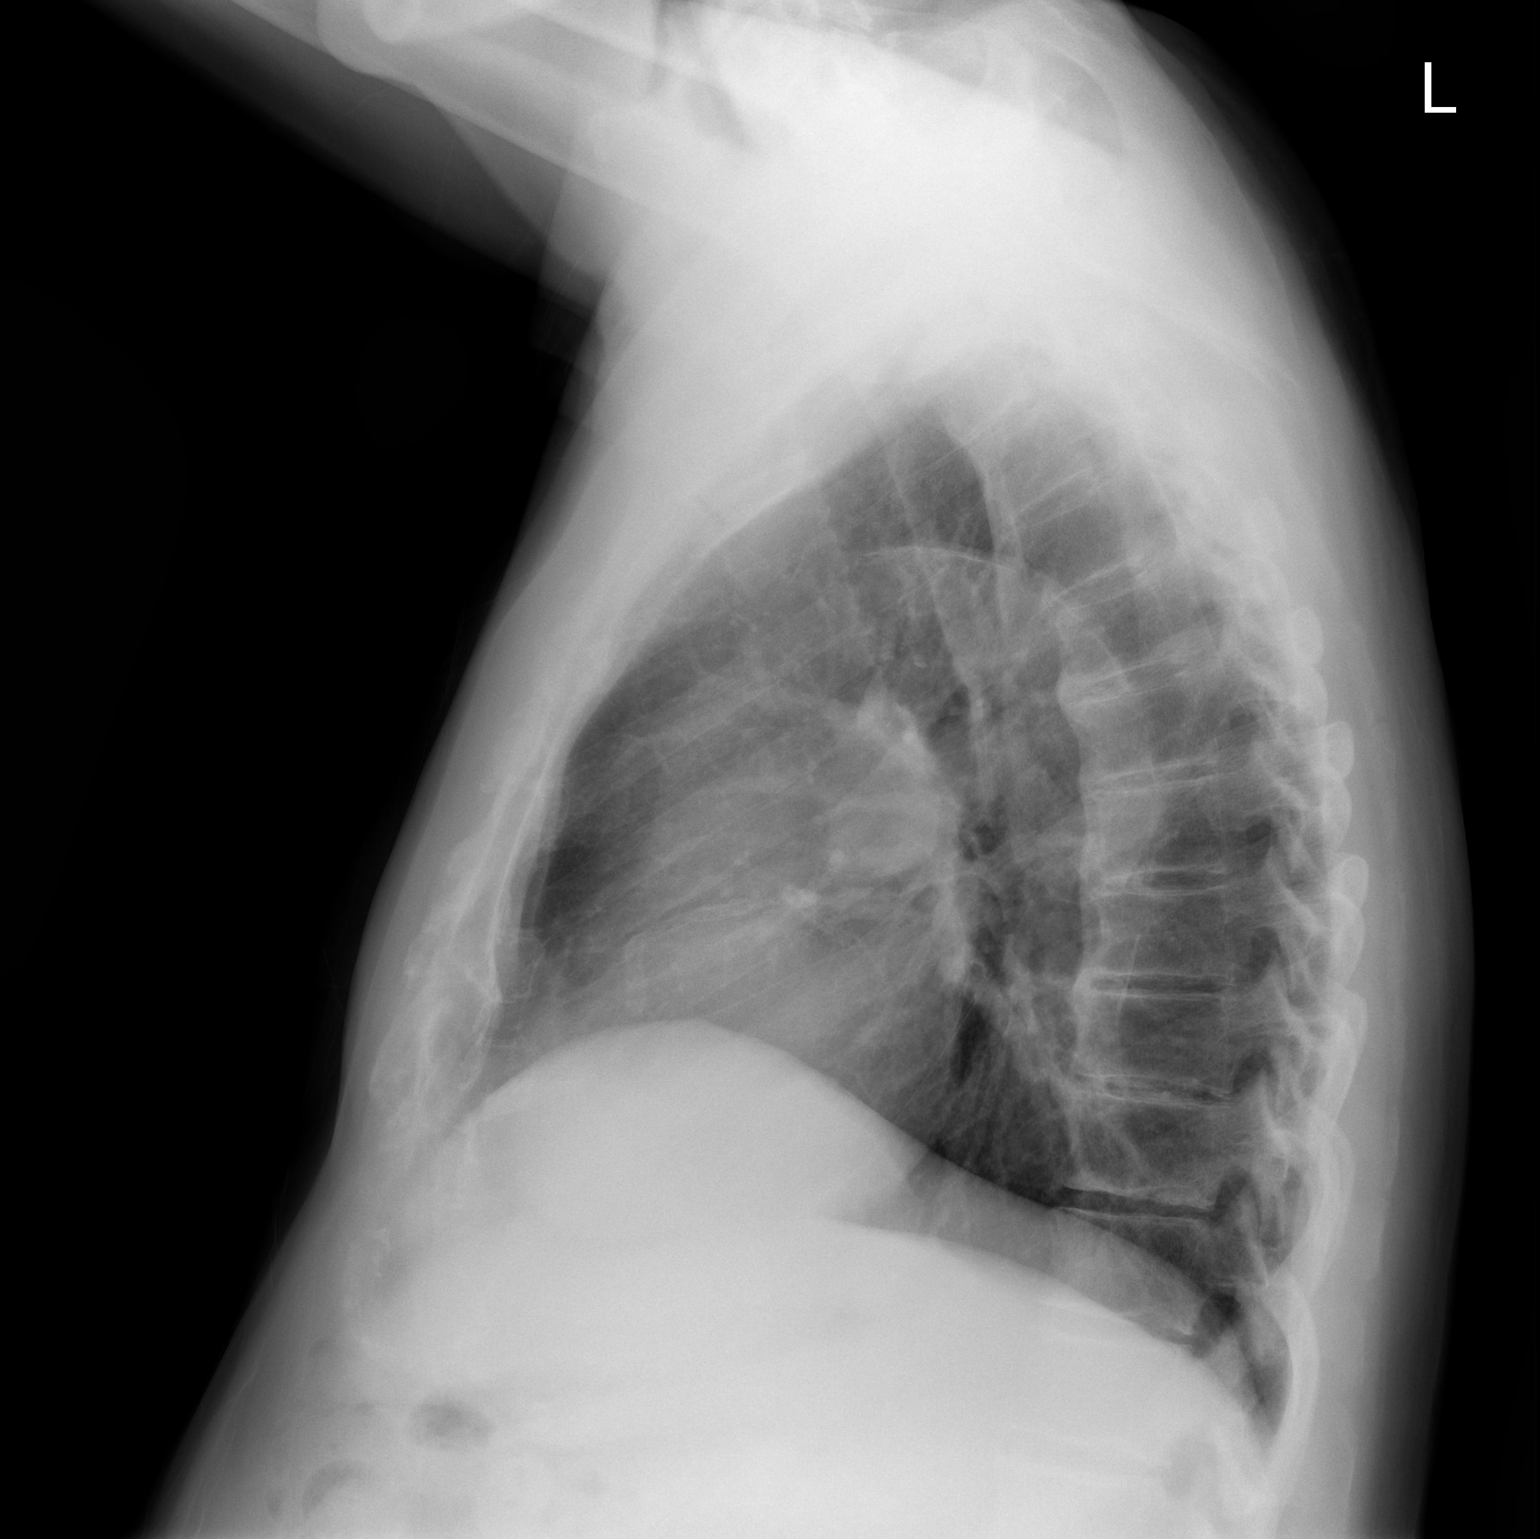

[2 of 2 positions shown; findings below may reference images not displayed]

FINDINGS: Cardiac shadows within normal limits. Diffuse aortic calcifications
are noted. The lungs are clear. Mild degenerative change of the
thoracic spine is noted.
IMPRESSION: No acute abnormality noted.

## 2024-04-27 ENCOUNTER — Other Ambulatory Visit: Payer: Self-pay

## 2024-04-27 ENCOUNTER — Emergency Department (HOSPITAL_BASED_OUTPATIENT_CLINIC_OR_DEPARTMENT_OTHER)
Admission: EM | Admit: 2024-04-27 | Discharge: 2024-04-27 | Disposition: A | Discharge status: Home / Self Care | Source: Home / Self Care | Attending: Emergency Medicine | Admitting: Emergency Medicine

## 2024-04-27 ENCOUNTER — Encounter (HOSPITAL_BASED_OUTPATIENT_CLINIC_OR_DEPARTMENT_OTHER): Payer: Self-pay

## 2024-04-27 DIAGNOSIS — N3091 Cystitis, unspecified with hematuria: Secondary | ICD-10-CM | POA: Insufficient documentation

## 2024-04-27 DIAGNOSIS — Z7901 Long term (current) use of anticoagulants: Secondary | ICD-10-CM | POA: Insufficient documentation

## 2024-04-27 DIAGNOSIS — N3090 Cystitis, unspecified without hematuria: Secondary | ICD-10-CM

## 2024-04-27 DIAGNOSIS — R319 Hematuria, unspecified: Secondary | ICD-10-CM | POA: Diagnosis present

## 2024-04-27 DIAGNOSIS — Z7982 Long term (current) use of aspirin: Secondary | ICD-10-CM | POA: Insufficient documentation

## 2024-04-27 LAB — URINALYSIS, ROUTINE W REFLEX MICROSCOPIC
Leukocytes,Ua: NEGATIVE
Specific Gravity, Urine: 1.015 (ref 1.005–1.030)
pH: 8.5 — ABNORMAL HIGH (ref 5.0–8.0)

## 2024-04-27 LAB — URINALYSIS, MICROSCOPIC (REFLEX): RBC / HPF: >50 RBC/hpf (ref 0–5)

## 2024-04-27 MED ORDER — CEFTRIAXONE SODIUM 500 MG IJ SOLR
500.0000 mg | Freq: Once | INTRAMUSCULAR | Status: AC
  Administered 2024-04-27: 500 mg via INTRAMUSCULAR
  Filled 2024-04-27: qty 500

## 2024-04-27 MED ORDER — STERILE WATER FOR INJECTION IJ SOLN
INTRAMUSCULAR | Status: AC
  Administered 2024-04-27: 1.2 mL
  Filled 2024-04-27: qty 10

## 2024-04-27 MED ORDER — CEFPODOXIME PROXETIL 200 MG PO TABS: 200.0000 mg | ORAL_TABLET | Freq: Two times a day (BID) | ORAL | 0 refills | Status: AC

## 2024-04-27 NOTE — ED Triage Notes (Addendum)
 Reports blood in urostomy bag since yesterday Denies abd pain, NV.  States not sure if he's been running a fever   States last time this happened, I had a UTI

## 2024-04-27 NOTE — ED Provider Notes (Signed)
 " [place] [facility] Provider Note   CSN: [id number] Arrival date & time: 04/27/24  1538     Patient presents with: Hematuria   Jim Bowers is a 83 y.o. male.   83 year old male here today for hematuria.  He has a history of a urostomy bag.  He says that his urine changed color earlier today.  Denies any fever or chills.  No back pain.   Hematuria       Prior to Admission medications  Medication Sig Start Date End Date Taking? Authorizing Provider  allopurinol  (ZYLOPRIM ) 300 MG tablet Take 300 mg by mouth daily.  04/19/18   [provider]  aspirin  EC 81 MG tablet Take 81 mg by mouth every evening.     [provider]  atorvastatin  (LIPITOR) 20 MG tablet Take 20 mg by mouth every evening.  03/17/18   [provider]  carvedilol  (COREG ) 25 MG tablet Take 12.5 mg by mouth 2 (two) times daily with a meal.  11/18/17   [provider]  clopidogrel  (PLAVIX ) 75 MG tablet Take 75 mg by mouth daily after breakfast.  02/25/18   [provider]  famotidine  (PEPCID ) 20 MG tablet Take 20 mg by mouth 2 (two) times daily.     [provider]  gabapentin  (NEURONTIN ) 300 MG capsule Take 300 mg by mouth 2 (two) times daily.  02/25/18   [provider]  loperamide (IMODIUM) 2 MG capsule Take 2 mg by mouth every 8 (eight) hours as needed for diarrhea or loose stools.  10/16/16   [provider]  losartan (COZAAR) 50 MG tablet Take 50 mg by mouth daily after breakfast.  03/17/18   [provider]  metFORMIN (GLUCOPHAGE-XR) 500 MG 24 hr tablet Take 500 mg by mouth every evening.  01/11/18   [provider]  Multiple Vitamins-Minerals (ICAPS AREDS 2 PO) Take 1 tablet by mouth 2 (two) times daily.    [provider]  nitroGLYCERIN (NITROSTAT) 0.4 MG SL tablet Place 0.4 mg under the tongue every 5 (five) minutes as needed for chest pain.  08/19/17   [provider]  nystatin (MYCOSTATIN/NYSTOP) powder Apply 1 g topically 2 (two) times daily as needed (irritation).     [provider]  sitaGLIPtin (JANUVIA) 100 MG tablet Take 100 mg by mouth daily after breakfast.  04/14/18   [provider]  traMADol (ULTRAM) 50 MG tablet Take 50 mg by mouth every 6 (six) hours as needed for moderate pain or severe pain.  07/02/16   [provider]  vitamin B-12 (CYANOCOBALAMIN) 1000 MCG tablet Take 1,000 mcg by mouth every evening.     [provider]    Allergies: Codeine, Hyoscyamine, Glimepiride, Pioglitazone, Colchicine, Iodinated contrast media, and Shellfish allergy    Review of Systems  Genitourinary:  Positive for hematuria.    Updated Vital Signs BP 117/70 (BP Location: Right Arm)   Pulse 74   Temp 98 F (36.7 C)   Resp 18   SpO2 98%   Physical Exam Vitals and nursing note reviewed.  Cardiovascular:     Rate and Rhythm: Normal rate.  Abdominal:     General: Abdomen is flat. There is no distension.     Tenderness: There is no abdominal tenderness.  Neurological:     Mental Status: He is alert.     (all labs ordered are listed, but only abnormal results are displayed) Labs Reviewed  URINALYSIS, ROUTINE W  REFLEX MICROSCOPIC    EKG: None  Radiology: No results found.   Procedures   Medications Ordered in the ED - No data to display                                  Medical Decision Making 83 year old male here today with blood in his urostomy bag.  Differential diagnoses include cystitis, less likely upon nephritis, less likely nephrolithiasis.  Plan -patient does have discoloration in his bag.  Will check a urinalysis but anticipate there to be infection.  Patient does not have any urine cultures in our system.  Likely will start with Rocephin , plan to send prescription for additional antibiotics.  I considered labs in this patient, however they would not change management given the  patient is nontoxic, has no abdominal flank or back pain.  This patient's health care is complicated by the following social terms of health-lack of access to primary care.  I confirmed the patient's medications with him at bedside.  I reviewed the patient's most recent outpatient PCP note.  Reassessment 7:20 PM-reviewed the patient's urinalysis which is difficult to interpret, but given sudden onset we will treat this as UTI.  Will follow culture.  Will send patient with prescription for cefpodoxime.  Amount and/or Complexity of Data Reviewed Labs: ordered.  Risk Prescription drug management.       Final diagnoses:  None    ED Discharge Orders     None          Mannie Fairy DASEN, DO 04/27/24 2323  "

## 2024-04-27 NOTE — Discharge Instructions (Signed)
 Your urine was sent for culture.  You received a dose of an antibiotic here in the emergency room.  I have also sent your prescription for cefpodoxime, which is an antibiotic that you can take once in the morning and once in the evening for the next 1 week.  Your pharmacy should be open at 10 AM tomorrow.
